# Patient Record
Sex: Male | Born: 1980
Health system: Southern US, Community
[De-identification: ages and names within clinical notes are randomized; demographics above are authoritative.]

## PROBLEM LIST (undated history)

## (undated) DIAGNOSIS — J45909 Unspecified asthma, uncomplicated: Secondary | ICD-10-CM

---

## 2009-08-31 ENCOUNTER — Encounter: Admission: RE | Admit: 2009-08-31 | Discharge: 2009-08-31 | Payer: Self-pay | Admitting: Family Medicine

## 2014-02-07 ENCOUNTER — Emergency Department (HOSPITAL_BASED_OUTPATIENT_CLINIC_OR_DEPARTMENT_OTHER)
Admission: EM | Admit: 2014-02-07 | Discharge: 2014-02-07 | Disposition: A | Discharge status: Home / Self Care | Payer: Self-pay | Attending: Emergency Medicine | Admitting: Emergency Medicine

## 2014-02-07 ENCOUNTER — Encounter (HOSPITAL_BASED_OUTPATIENT_CLINIC_OR_DEPARTMENT_OTHER): Payer: Self-pay | Admitting: Emergency Medicine

## 2014-02-07 ENCOUNTER — Emergency Department (HOSPITAL_BASED_OUTPATIENT_CLINIC_OR_DEPARTMENT_OTHER): Payer: Self-pay

## 2014-02-07 DIAGNOSIS — Y92410 Unspecified street and highway as the place of occurrence of the external cause: Secondary | ICD-10-CM | POA: Insufficient documentation

## 2014-02-07 DIAGNOSIS — Y9389 Activity, other specified: Secondary | ICD-10-CM | POA: Insufficient documentation

## 2014-02-07 DIAGNOSIS — S161XXA Strain of muscle, fascia and tendon at neck level, initial encounter: Secondary | ICD-10-CM | POA: Insufficient documentation

## 2014-02-07 DIAGNOSIS — J45909 Unspecified asthma, uncomplicated: Secondary | ICD-10-CM | POA: Insufficient documentation

## 2014-02-07 DIAGNOSIS — S0990XA Unspecified injury of head, initial encounter: Secondary | ICD-10-CM | POA: Insufficient documentation

## 2014-02-07 DIAGNOSIS — S8012XA Contusion of left lower leg, initial encounter: Secondary | ICD-10-CM | POA: Insufficient documentation

## 2014-02-07 HISTORY — DX: Unspecified asthma, uncomplicated: J45.909

## 2014-02-07 MED ORDER — IBUPROFEN 800 MG PO TABS
800.0000 mg | ORAL_TABLET | Freq: Once | ORAL | Status: AC
  Administered 2014-02-07: 800 mg via ORAL
  Filled 2014-02-07: qty 1
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
## 2014-02-07 NOTE — ED Notes (Signed)
Pt given ice packs for home use- directed to pharmacy to pick ibuprofen if needed at d/c- pt ambulatory with steady gait at d/c

## 2014-02-09 ENCOUNTER — Emergency Department (HOSPITAL_BASED_OUTPATIENT_CLINIC_OR_DEPARTMENT_OTHER)
Admission: EM | Admit: 2014-02-09 | Discharge: 2014-02-09 | Disposition: A | Discharge status: Home / Self Care | Payer: Self-pay | Attending: Emergency Medicine | Admitting: Emergency Medicine

## 2014-02-09 ENCOUNTER — Encounter (HOSPITAL_BASED_OUTPATIENT_CLINIC_OR_DEPARTMENT_OTHER): Payer: Self-pay | Admitting: Emergency Medicine

## 2014-02-09 DIAGNOSIS — S8991XD Unspecified injury of right lower leg, subsequent encounter: Secondary | ICD-10-CM | POA: Insufficient documentation

## 2014-02-09 DIAGNOSIS — S2020XD Contusion of thorax, unspecified, subsequent encounter: Secondary | ICD-10-CM | POA: Insufficient documentation

## 2014-02-09 DIAGNOSIS — S0990XD Unspecified injury of head, subsequent encounter: Secondary | ICD-10-CM | POA: Insufficient documentation

## 2014-02-09 DIAGNOSIS — S20219D Contusion of unspecified front wall of thorax, subsequent encounter: Secondary | ICD-10-CM

## 2014-02-09 DIAGNOSIS — J45909 Unspecified asthma, uncomplicated: Secondary | ICD-10-CM | POA: Insufficient documentation

## 2014-02-09 DIAGNOSIS — S161XXD Strain of muscle, fascia and tendon at neck level, subsequent encounter: Secondary | ICD-10-CM | POA: Insufficient documentation

## 2014-02-09 DIAGNOSIS — S3982XD Other specified injuries of lower back, subsequent encounter: Secondary | ICD-10-CM | POA: Insufficient documentation

## 2014-02-09 MED ORDER — CYCLOBENZAPRINE HCL 10 MG PO TABS: 10.0000 mg | ORAL_TABLET | Freq: Three times a day (TID) | ORAL | Status: DC | PRN

## 2014-02-09 MED ORDER — HYDROCODONE-ACETAMINOPHEN 5-325 MG PO TABS: 2.0000 | ORAL_TABLET | ORAL | Status: DC | PRN
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
## 2014-02-09 NOTE — Discharge Instructions (Signed)
Cervical Sprain °A cervical sprain is an injury in the neck in which the strong, fibrous tissues (ligaments) that connect your neck bones stretch or tear. Cervical sprains can range from mild to severe. Severe cervical sprains can cause the neck vertebrae to be unstable. This can lead to damage of the spinal cord and can result in serious nervous system problems. The amount of time it takes for a cervical sprain to get better depends on the cause and extent of the injury. Most cervical sprains heal in 1 to 3 weeks. °CAUSES  °Severe cervical sprains may be caused by:  °· Contact sport injuries (such as from football, rugby, wrestling, hockey, auto racing, gymnastics, diving, martial arts, or boxing).   °· Motor vehicle collisions.   °· Whiplash injuries. This is an injury from a sudden forward and backward whipping movement of the head and neck.  °· Falls.   °Mild cervical sprains may be caused by:  °· Being in an awkward position, such as while cradling a telephone between your ear and shoulder.   °· Sitting in a chair that does not offer proper support.   °· Working at a poorly designed computer station.   °· Looking up or down for long periods of time.   °SYMPTOMS  °· Pain, soreness, stiffness, or a burning sensation in the front, back, or sides of the neck. This discomfort may develop immediately after the injury or slowly, 24 hours or more after the injury.   °· Pain or tenderness directly in the middle of the back of the neck.   °· Shoulder or upper back pain.   °· Limited ability to move the neck.   °· Headache.   °· Dizziness.   °· Weakness, numbness, or tingling in the hands or arms.   °· Muscle spasms.   °· Difficulty swallowing or chewing.   °· Tenderness and swelling of the neck.   °DIAGNOSIS  °Most of the time your health care provider can diagnose a cervical sprain by taking your history and doing a physical exam. Your health care provider will ask about previous neck injuries and any known neck  problems, such as arthritis in the neck. X-rays may be taken to find out if there are any other problems, such as with the bones of the neck. Other tests, such as a CT scan or MRI, may also be needed.  °TREATMENT  °Treatment depends on the severity of the cervical sprain. Mild sprains can be treated with rest, keeping the neck in place (immobilization), and pain medicines. Severe cervical sprains are immediately immobilized. Further treatment is done to help with pain, muscle spasms, and other symptoms and may include: °· Medicines, such as pain relievers, numbing medicines, or muscle relaxants.   °· Physical therapy. This may involve stretching exercises, strengthening exercises, and posture training. Exercises and improved posture can help stabilize the neck, strengthen muscles, and help stop symptoms from returning.   °HOME CARE INSTRUCTIONS  °· Put ice on the injured area.   °¨ Put ice in a plastic bag.   °¨ Place a towel between your skin and the bag.   °¨ Leave the ice on for 15-20 minutes, 3-4 times a day.   °· If your injury was severe, you may have been given a cervical collar to wear. A cervical collar is a two-piece collar designed to keep your neck from moving while it heals. °¨ Do not remove the collar unless instructed by your health care provider. °¨ If you have long hair, keep it outside of the collar. °¨ Ask your health care provider before making any adjustments to your collar. Minor   adjustments may be required over time to improve comfort and reduce pressure on your chin or on the back of your head. °· If you are allowed to remove the collar for cleaning or bathing, follow your health care provider's instructions on how to do so safely. °· Keep your collar clean by wiping it with mild soap and water and drying it completely. If the collar you have been given includes removable pads, remove them every 1-2 days and hand wash them with soap and water. Allow them to air dry. They should be completely  dry before you wear them in the collar. °· If you are allowed to remove the collar for cleaning and bathing, wash and dry the skin of your neck. Check your skin for irritation or sores. If you see any, tell your health care provider. °· Do not drive while wearing the collar.   °· Only take over-the-counter or prescription medicines for pain, discomfort, or fever as directed by your health care provider.   °· Keep all follow-up appointments as directed by your health care provider.   °· Keep all physical therapy appointments as directed by your health care provider.   °· Make any needed adjustments to your workstation to promote good posture.   °· Avoid positions and activities that make your symptoms worse.   °· Warm up and stretch before being active to help prevent problems.   °SEEK MEDICAL CARE IF:  °· Your pain is not controlled with medicine.   °· You are unable to decrease your pain medicine over time as planned.   °· Your activity level is not improving as expected.   °SEEK IMMEDIATE MEDICAL CARE IF:  °· You develop any bleeding. °· You develop stomach upset. °· You have signs of an allergic reaction to your medicine.   °· Your symptoms get worse.   °· You develop new, unexplained symptoms.   °· You have numbness, tingling, weakness, or paralysis in any part of your body.   °MAKE SURE YOU:  °· Understand these instructions. °· Will watch your condition. °· Will get help right away if you are not doing well or get worse. °Document Released: 02/16/2007 Document Revised: 04/26/2013 Document Reviewed: 10/27/2012 °[employer]® Patient Information ©2015 [employer]. This information is not intended to replace advice given to you by your health care provider. Make sure you discuss any questions you have with your health care provider. ° °Blunt Chest Trauma °Blunt chest trauma is an injury caused by a blow to the chest. These chest injuries can be very painful. Blunt chest trauma often results in bruised or broken  (fractured) ribs. Most cases of bruised and fractured ribs from blunt chest traumas get better after 1 to 3 weeks of rest and pain medicine. Often, the soft tissue in the chest wall is also injured, causing pain and bruising. Internal organs, such as the heart and lungs, may also be injured. Blunt chest trauma can lead to serious medical problems. This injury requires immediate medical care. °CAUSES  °· Motor vehicle collisions. °· Falls. °· Physical violence. °· Sports injuries. °SYMPTOMS  °· Chest pain. The pain may be worse when you move or breathe deeply. °· Shortness of breath. °· Lightheadedness. °· Bruising. °· Tenderness. °· Swelling. °DIAGNOSIS  °Your caregiver will do a physical exam. X-rays may be taken to look for fractures. However, minor rib fractures may not show up on X-rays until a few days after the injury. If a more serious injury is suspected, further imaging tests may be done. This may include ultrasounds, computed tomography (CT) scans, or magnetic   resonance imaging (MRI). °TREATMENT  °Treatment depends on the severity of your injury. Your caregiver may prescribe pain medicines and deep breathing exercises. °HOME CARE INSTRUCTIONS °· Limit your activities until you can move around without much pain. °· Do not do any strenuous work until your injury is healed. °· Put ice on the injured area. °¨ Put ice in a plastic bag. °¨ Place a towel between your skin and the bag. °¨ Leave the ice on for 15-20 minutes, 03-04 times a day. °· You may wear a rib belt as directed by your caregiver to reduce pain. °· Practice deep breathing as directed by your caregiver to keep your lungs clear. °· Only take over-the-counter or prescription medicines for pain, fever, or discomfort as directed by your caregiver. °SEEK IMMEDIATE MEDICAL CARE IF:  °· You have increasing pain or shortness of breath. °· You cough up blood. °· You have nausea, vomiting, or abdominal pain. °· You have a fever. °· You feel dizzy, weak, or  you faint. °MAKE SURE YOU: °· Understand these instructions. °· Will watch your condition. °· Will get help right away if you are not doing well or get worse. °Document Released: 05/29/2004 Document Revised: 07/14/2011 Document Reviewed: 02/05/2011 °[employer]® Patient Information ©2015 [employer]. This information is not intended to replace advice given to you by your health care provider. Make sure you discuss any questions you have with your health care provider. ° °

## 2016-01-29 ENCOUNTER — Ambulatory Visit (INDEPENDENT_AMBULATORY_CARE_PROVIDER_SITE_OTHER): Payer: Self-pay | Admitting: Physician Assistant

## 2016-01-29 VITALS — BP 124/80 | HR 67 | Temp 99.1°F | Resp 17 | Ht 73.5 in | Wt 201.0 lb

## 2016-01-29 DIAGNOSIS — K5900 Constipation, unspecified: Secondary | ICD-10-CM

## 2016-01-29 DIAGNOSIS — Z0000 Encounter for general adult medical examination without abnormal findings: Secondary | ICD-10-CM

## 2016-01-29 LAB — POCT CBC
Granulocyte percent: 43.9 %G (ref 37–80)
HCT, POC: 39.2 % — AB (ref 43.5–53.7)
Hemoglobin: 13.4 g/dL — AB (ref 14.1–18.1)
Lymph, poc: 2.5 (ref 0.6–3.4)
MCH, POC: 26.3 pg — AB (ref 27–31.2)
MCHC: 34.2 g/dL (ref 31.8–35.4)
MCV: 76.7 fL — AB (ref 80–97)
MID (cbc): 0.5 (ref 0–0.9)
MPV: 8.1 fL (ref 0–99.8)
POC Granulocyte: 2.3 (ref 2–6.9)
POC LYMPH PERCENT: 47.6 %L (ref 10–50)
POC MID %: 8.5 %M (ref 0–12)
Platelet Count, POC: 280 10*3/uL (ref 142–424)
RBC: 5.11 M/uL (ref 4.69–6.13)
RDW, POC: 12 %
WBC: 5.3 10*3/uL (ref 4.6–10.2)

## 2016-01-29 LAB — BASIC METABOLIC PANEL
BUN: 10 mg/dL (ref 7–25)
CO2: 25 mmol/L (ref 20–31)
Calcium: 9.8 mg/dL (ref 8.6–10.3)
Chloride: 105 mmol/L (ref 98–110)
Creat: 0.96 mg/dL (ref 0.60–1.35)
Glucose, Bld: 89 mg/dL (ref 65–99)
Potassium: 4.6 mmol/L (ref 3.5–5.3)
Sodium: 138 mmol/L (ref 135–146)

## 2016-01-29 LAB — LIPID PANEL
Cholesterol: 210 mg/dL — ABNORMAL HIGH (ref 125–200)
HDL: 41 mg/dL (ref >=40)
LDL Cholesterol: 156 mg/dL — ABNORMAL HIGH (ref <130)
Total CHOL/HDL Ratio: 5.1 Ratio — ABNORMAL HIGH (ref <=5.0)
Triglycerides: 67 mg/dL (ref <150)
VLDL: 13 mg/dL (ref <30)

## 2016-01-29 NOTE — Patient Instructions (Addendum)
Call you insurance and ask if you are covered for Dental insurance, ask which providers are covered under your insurance. Make an appointment and go!   Drink at least 2 liters of water a day and eat at least 20-35 grams of fiber a day. This combination will help you have softer and more regular stools.     Constipation, Adult Constipation is when a person has fewer than three bowel movements a week, has difficulty having a bowel movement, or has stools that are dry, hard, or larger than normal. As people grow older, constipation is more common. A low-fiber diet, not taking in enough fluids, and taking certain medicines may make constipation worse.  CAUSES   Certain medicines, such as antidepressants, pain medicine, iron supplements, antacids, and water pills.   Certain diseases, such as diabetes, irritable bowel syndrome (IBS), thyroid disease, or depression.   Not drinking enough water.   Not eating enough fiber-rich foods.   Stress or travel.   Lack of physical activity or exercise.   Ignoring the urge to have a bowel movement.   Using laxatives too much.  SIGNS AND SYMPTOMS   Having fewer than three bowel movements a week.   Straining to have a bowel movement.   Having stools that are hard, dry, or larger than normal.   Feeling full or bloated.   Pain in the lower abdomen.   Not feeling relief after having a bowel movement.  DIAGNOSIS  Your health care provider will take a medical history and perform a physical exam. Further testing may be done for severe constipation. Some tests may include:  A barium enema X-ray to examine your rectum, colon, and, sometimes, your small intestine.   A sigmoidoscopy to examine your lower colon.   A colonoscopy to examine your entire colon. TREATMENT  Treatment will depend on the severity of your constipation and what is causing it. Some dietary treatments include drinking more fluids and eating more fiber-rich foods.  Lifestyle treatments may include regular exercise. If these diet and lifestyle recommendations do not help, your health care provider may recommend taking over-the-counter laxative medicines to help you have bowel movements. Prescription medicines may be prescribed if over-the-counter medicines do not work.  HOME CARE INSTRUCTIONS   Eat foods that have a lot of fiber, such as fruits, vegetables, whole grains, and beans.  Limit foods high in fat and processed sugars, such as french fries, hamburgers, cookies, candies, and soda.   A fiber supplement may be added to your diet if you cannot get enough fiber from foods.   Drink enough fluids to keep your urine clear or pale yellow. At least 2 liters  Exercise regularly or as directed by your health care provider.   Go to the restroom when you have the urge to go. Do not hold it.   Only take over-the-counter or prescription medicines as directed by your health care provider. Do not take other medicines for constipation without talking to your health care provider first.  SEEK IMMEDIATE MEDICAL CARE IF:   You have bright red blood in your stool.   Your constipation lasts for more than 4 days or gets worse.   You have abdominal or rectal pain.   You have thin, pencil-like stools.   You have unexplained weight loss.  This information is not intended to replace advice given to you by your health care provider. Make sure you discuss any questions you have with your health care provider.    Heart-Healthy  Eating Plan Many factors influence your heart health, including eating and exercise habits. Heart (coronary) risk increases with abnormal blood fat (lipid) levels. Heart-healthy meal planning includes limiting unhealthy fats, increasing healthy fats, and making other small dietary changes. This includes maintaining a healthy body weight to help keep lipid levels within a normal range. WHAT TYPES OF FAT SHOULD I CHOOSE?  Choose  healthy fats more often. Choose monounsaturated and polyunsaturated fats, such as olive oil and canola oil, flaxseeds, walnuts, almonds, and seeds.  Eat more omega-3 fats. Good choices include salmon, mackerel, sardines, tuna, flaxseed oil, and ground flaxseeds. Aim to eat fish at least two times each week.  Limit saturated fats. Saturated fats are primarily found in animal products, such as meats, butter, and cream. Plant sources of saturated fats include palm oil, palm kernel oil, and coconut oil.  Avoid foods with partially hydrogenated oils in them. These contain trans fats. Examples of foods that contain trans fats are stick margarine, some tub margarines, cookies, crackers, and other baked goods. WHAT GENERAL GUIDELINES DO I NEED TO FOLLOW?  Check food labels carefully to identify foods with trans fats or high amounts of saturated fat.  Fill one half of your plate with vegetables and green salads. Eat 4-5 servings of vegetables per day. A serving of vegetables equals 1 cup of raw leafy vegetables,  cup of raw or cooked cut-up vegetables, or  cup of vegetable juice.  Fill one fourth of your plate with whole grains. Look for the word "whole" as the first word in the ingredient list.  Fill one fourth of your plate with lean protein foods.  Eat 4-5 servings of fruit per day. A serving of fruit equals one medium whole fruit,  cup of dried fruit,  cup of fresh, frozen, or canned fruit, or  cup of 100% fruit juice.  Eat more foods that contain soluble fiber. Examples of foods that contain this type of fiber are apples, broccoli, carrots, beans, peas, and barley. Aim to get 20-30 g of fiber per day.  Eat more home-cooked food and less restaurant, buffet, and fast food.  Limit or avoid alcohol.  Limit foods that are high in starch and sugar.  Avoid fried foods.  Cook foods by using methods other than frying. Baking, boiling, grilling, and broiling are all great options. Other  fat-reducing suggestions include:  Removing the skin from poultry.  Removing all visible fats from meats.  Skimming the fat off of stews, soups, and gravies before serving them.  Steaming vegetables in water or broth.  Lose weight if you are overweight. Losing just 5-10% of your initial body weight can help your overall health and prevent diseases such as diabetes and heart disease.  Increase your consumption of nuts, legumes, and seeds to 4-5 servings per week. One serving of dried beans or legumes equals  cup after being cooked, one serving of nuts equals 1 ounces, and one serving of seeds equals  ounce or 1 tablespoon.  You may need to monitor your salt (sodium) intake, especially if you have high blood pressure. Talk with your health care provider or dietitian to get more information about reducing sodium. WHAT FOODS CAN I EAT? Grains Breads, including [place], white, pita, wheat, raisin, rye, oatmeal, and [place]. Tortillas that are neither fried nor made with lard or trans fat. Low-fat rolls, including hotdog and hamburger buns and English muffins. Biscuits. Muffins. Waffles. Pancakes. Light popcorn. Whole-grain cereals. Flatbread. Melba toast. Pretzels. Breadsticks. Rusks. Low-fat snacks and  crackers, including oyster, saltine, matzo, graham, animal, and rye. Rice and pasta, including brown rice and those that are made with whole wheat. Vegetables All vegetables. Fruits All fruits, but limit coconut. Meats and Other Protein Sources Lean, well-trimmed beef, veal, pork, and lamb. Chicken and [place] without skin. All fish and shellfish. Wild duck, rabbit, pheasant, and venison. Egg whites or low-cholesterol egg substitutes. Dried beans, peas, lentils, and tofu.Seeds and most nuts. Dairy Low-fat or nonfat cheeses, including ricotta, string, and mozzarella. Skim or 1% milk that is liquid, powdered, or evaporated. Buttermilk that is made with low-fat milk. Nonfat or low-fat  yogurt. Beverages Mineral water. Diet carbonated beverages. Sweets and Desserts Sherbets and fruit ices. Honey, jam, marmalade, jelly, and syrups. Meringues and gelatins. Pure sugar candy, such as hard candy, jelly beans, gumdrops, mints, marshmallows, and small amounts of dark chocolate. [employer]. Eat all sweets and desserts in moderation. Fats and Oils Nonhydrogenated (trans-free) margarines. Vegetable oils, including soybean, sesame, sunflower, olive, peanut, safflower, corn, canola, and cottonseed. Salad dressings or mayonnaise that are made with a vegetable oil. Limit added fats and oils that you use for cooking, baking, salads, and as spreads. Other Cocoa powder. Coffee and tea. All seasonings and condiments. The items listed above may not be a complete list of recommended foods or beverages. Contact your dietitian for more options. WHAT FOODS ARE NOT RECOMMENDED? Grains Breads that are made with saturated or trans fats, oils, or whole milk. Croissants. Butter rolls. Cheese breads. Sweet rolls. Donuts. Buttered popcorn. Chow mein noodles. High-fat crackers, such as cheese or butter crackers. Meats and Other Protein Sources Fatty meats, such as hotdogs, short ribs, sausage, spareribs, bacon, ribeye roast or steak, and mutton. High-fat deli meats, such as salami and bologna. Caviar. Domestic duck and goose. Organ meats, such as kidney, liver, sweetbreads, brains, gizzard, chitterlings, and heart. Dairy Cream, sour cream, cream cheese, and creamed cottage cheese. Whole milk cheeses, including blue (bleu), [address], [place], [place], [address], [place], [address], [place], [place], and [place]. Whole or 2% milk that is liquid, evaporated, or condensed. Whole buttermilk. Cream sauce or high-fat cheese sauce. Yogurt that is made from whole milk. Beverages Regular sodas and drinks with added sugar. Sweets and Desserts Frosting. Pudding. Cookies. Cakes other than angel food cake. Candy that  has milk chocolate or white chocolate, hydrogenated fat, butter, coconut, or unknown ingredients. Buttered syrups. Full-fat ice cream or ice cream drinks. Fats and Oils Gravy that has suet, meat fat, or shortening. Cocoa butter, hydrogenated oils, palm oil, coconut oil, palm kernel oil. These can often be found in baked products, candy, fried foods, nondairy creamers, and whipped toppings. Solid fats and shortenings, including bacon fat, salt pork, lard, and butter. Nondairy cream substitutes, such as coffee creamers and sour cream substitutes. Salad dressings that are made of unknown oils, cheese, or sour cream. The items listed above may not be a complete list of foods and beverages to avoid. Contact your dietitian for more information.   This information is not intended to replace advice given to you by your health care provider. Make sure you discuss any questions you have with your health care provider.   Exercising to Stay Healthy Exercising regularly is important. It has many health benefits, such as:  Improving your overall fitness, flexibility, and endurance.  Increasing your bone density.  Helping with weight control.  Decreasing your body fat.  Increasing your muscle strength.  Reducing stress and tension.  Improving your overall health. In order to become  healthy and stay healthy, it is recommended that you do moderate-intensity and vigorous-intensity exercise. You can tell that you are exercising at a moderate intensity if you have a higher heart rate and faster breathing, but you are still able to hold a conversation. You can tell that you are exercising at a vigorous intensity if you are breathing much harder and faster and cannot hold a conversation while exercising. HOW OFTEN SHOULD I EXERCISE? Choose an activity that you enjoy and set realistic goals. Your health care provider can help you to make an activity plan that works for you. Exercise regularly as directed by your  health care provider. This may include:   Doing resistance training twice each week, such as:  Push-ups.  Sit-ups.  Lifting weights.  Using resistance bands.  Doing a given intensity of exercise for a given amount of time. Choose from these options:  150 minutes of moderate-intensity exercise every week.  75 minutes of vigorous-intensity exercise every week.  A mix of moderate-intensity and vigorous-intensity exercise every week. Children, pregnant women, people who are out of shape, people who are overweight, and older adults may need to consult a health care provider for individual recommendations. If you have any sort of medical condition, be sure to consult your health care provider before starting a new exercise program.  WHAT ARE SOME EXERCISE IDEAS? Some moderate-intensity exercise ideas include:   Walking at a rate of 1 mile in 15 minutes.  Biking.  Hiking.  Golfing.  Dancing. Some vigorous-intensity exercise ideas include:   Walking at a rate of at least 4.5 miles per hour.  Jogging or running at a rate of 5 miles per hour.  Biking at a rate of at least 10 miles per hour.  Lap swimming.  Roller-skating or in-line skating.  Cross-country skiing.  Vigorous competitive sports, such as football, basketball, and soccer.  Jumping rope.  Aerobic dancing. WHAT ARE SOME EVERYDAY ACTIVITIES THAT CAN HELP ME TO GET EXERCISE?  Yard work, such as:  Child psychotherapist.  Raking and bagging leaves.  Washing and waxing your car.  Pushing a stroller.  Shoveling snow.  Gardening.  Washing windows or floors. HOW CAN I BE MORE ACTIVE IN MY DAY-TO-DAY ACTIVITIES?  Use the stairs instead of the elevator.  Take a walk during your lunch break.  If you drive, park your car farther away from work or school.  If you take public transportation, get off one stop early and walk the rest of the way.  Make all of your phone calls while standing up and walking  around.  Get up, stretch, and walk around every 30 minutes throughout the day. WHAT GUIDELINES SHOULD I FOLLOW WHILE EXERCISING?  Do not exercise so much that you hurt yourself, feel dizzy, or get very short of breath.  Consult your health care provider before starting a new exercise program.  Wear comfortable clothes and shoes with good support.  Drink plenty of water while you exercise to prevent dehydration or heat stroke. Body water is lost during exercise and must be replaced.  Work out until you breathe faster and your heart beats faster.

## 2016-01-29 NOTE — Progress Notes (Signed)
[facility] [address], [place] [address] [phone]  Date:  01/29/2016   Name:  Lee Alvarez   DOB:  1981-04-29   MRN:  [id number]  PCP:  No PCP Per Patient    Chief Complaint: Annual Exam   History of Present Illness:  This is a 35 y.o. male with no reported PMH who is presenting for CPE. Feeling well, no complaints.   Last physical 3 years ago, cannot recall which physician group. Notes he was told he had elevated cholesterol at that time. Started eating a good diet and getting regular exercise. Lost 30 lbs. Notes he has gained the weight back, does not exercise and doesn't eat a healthy diet. 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 Sits a lot for work, travels out of the country often for work. No medications/allergies to medications.  Seasonal allergies. Doesn't take anything. Notes they are not that bad. Worse in the spring.  Felt bump on testicles last year. No pain. Not there anymore.  Bowel movements every three days. Hard stools. No blood. Drinks one cup of water per day.   Complaints:  None Immunizations: believes he is UTD Dentist: Never.  Eye: None  Diet: Eats anything.  Exercise: None.  Fam hx: Uncle stroke. Deceased.  Sexual hx: Married. Same partner 9 years. Sexually active.  Urinary hesitancy/frequency or nocturia: None Problems with erectile dysfunction: None Tobacco/alcohol/substance use: None   Review of Systems:  Review of Systems  Constitutional: Negative for activity change, appetite change and fatigue.  HENT: Negative for congestion, dental problem, sneezing and tinnitus.   Eyes: Negative for visual disturbance.  Respiratory: Negative for cough, chest tightness, shortness of breath and wheezing.   Cardiovascular: Negative for chest pain, palpitations and leg swelling.  Gastrointestinal: Positive for constipation. Negative for abdominal pain, blood in stool, diarrhea, nausea and vomiting.  Endocrine: Negative  for polydipsia, polyphagia and polyuria.  Genitourinary: Negative for decreased urine volume, difficulty urinating, discharge, hematuria, scrotal swelling and testicular pain.  Musculoskeletal: Negative for arthralgias, back pain and neck stiffness.  Allergic/Immunologic: Negative for environmental allergies and food allergies.  Neurological: Negative for dizziness, syncope, weakness, light-headedness and headaches.  Psychiatric/Behavioral: Negative for sleep disturbance. The patient is not nervous/anxious.     There are no active problems to display for this patient.   Prior to Admission medications   Not on File    Allergies  Allergen Reactions  . Other     Fresh fruit- makes mouth breakout  . Quinine Derivatives Itching    No past surgical history on file.  Social History  Substance Use Topics  . Smoking status: Never Smoker  . Smokeless tobacco: Never Used  . Alcohol use No    No family history on file.  Medication list has been reviewed and updated.  Physical Examination:  Physical Exam  Constitutional: He is oriented to person, place, and time. He appears well-developed and well-nourished. No distress.  HENT:  Head: Normocephalic and atraumatic.  Right Ear: External ear normal.  Left Ear: External ear normal.  Mouth/Throat: Oropharynx is clear and moist. No oropharyngeal exudate.  Eyes: Conjunctivae and EOM are normal. Pupils are equal, round, and reactive to light.  Neck: Normal range of motion. Neck supple. No thyromegaly present.  Cardiovascular: Normal rate, regular rhythm, normal heart sounds and intact distal pulses.   No murmur heard. Pulmonary/Chest: Effort normal and breath sounds normal. No respiratory distress. He has no wheezes.  Abdominal: Soft. Normal appearance  and bowel sounds are normal. There is no tenderness.  Genitourinary: Right testis shows no mass, no swelling and no tenderness. Left testis shows no mass, no swelling and no tenderness.   Neurological: He is alert and oriented to person, place, and time.  Skin: Skin is warm and dry.  Psychiatric: He has a normal mood and affect. His behavior is normal. Judgment and thought content normal.  Vitals reviewed.   BP 124/80 (BP Location: Right Arm, Patient Position: Sitting, Cuff Size: Normal)   Pulse 67   Temp 99.1 F (37.3 C) (Oral)   Resp 17   Ht 6' 1.5" (1.867 m)   Wt 201 lb (91.2 kg)   SpO2 99%   BMI 26.16 kg/m   Assessment and Plan: 1. Annual physical exam - POCT CBC - HIV antibody - Lipid panel - Basic metabolic panel - Discussed with patient importance of regular exercise and a healthy diet. Will let patient know about lab results.   - Recommended patient make appointment with dentist, as he has never been.  - RTC PRN.  2. Constipation, unspecified constipation type - Information discussed and printed off for patient concerning constipation prevention and daily fiber/water intake. Drink at least 2 liters of water a day and eat at least 20-35 grams of fiber a day.

## 2016-01-30 ENCOUNTER — Encounter: Payer: Self-pay | Admitting: Physician Assistant

## 2016-01-30 LAB — HIV ANTIBODY (ROUTINE TESTING W REFLEX): HIV 1&2 Ab, 4th Generation: NONREACTIVE

## 2016-01-30 NOTE — Progress Notes (Signed)
Letter sent to patient about elevated lipid levels. Will try to correct this with lifestyle modification.  Information given about eating better diet and getting regular exercise.
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
## 2017-10-21 ENCOUNTER — Other Ambulatory Visit: Payer: Self-pay

## 2017-10-21 ENCOUNTER — Ambulatory Visit (INDEPENDENT_AMBULATORY_CARE_PROVIDER_SITE_OTHER): Payer: BLUE CROSS/BLUE SHIELD | Admitting: Physician Assistant

## 2017-10-21 ENCOUNTER — Encounter: Payer: Self-pay | Admitting: Physician Assistant

## 2017-10-21 VITALS — BP 116/82 | HR 63 | Temp 98.2°F | Resp 16 | Ht 73.0 in | Wt 200.4 lb

## 2017-10-21 DIAGNOSIS — Z113 Encounter for screening for infections with a predominantly sexual mode of transmission: Secondary | ICD-10-CM | POA: Diagnosis not present

## 2017-10-21 DIAGNOSIS — R079 Chest pain, unspecified: Secondary | ICD-10-CM

## 2017-10-21 DIAGNOSIS — Z13228 Encounter for screening for other metabolic disorders: Secondary | ICD-10-CM | POA: Diagnosis not present

## 2017-10-21 DIAGNOSIS — Z130 Encounter for screening for diseases of the blood and blood-forming organs and certain disorders involving the immune mechanism: Secondary | ICD-10-CM

## 2017-10-21 DIAGNOSIS — Z0000 Encounter for general adult medical examination without abnormal findings: Secondary | ICD-10-CM | POA: Diagnosis not present

## 2017-10-21 DIAGNOSIS — Z1329 Encounter for screening for other suspected endocrine disorder: Secondary | ICD-10-CM

## 2017-10-21 DIAGNOSIS — R0981 Nasal congestion: Secondary | ICD-10-CM

## 2017-10-21 DIAGNOSIS — N509 Disorder of male genital organs, unspecified: Secondary | ICD-10-CM | POA: Diagnosis not present

## 2017-10-21 DIAGNOSIS — N5089 Other specified disorders of the male genital organs: Secondary | ICD-10-CM

## 2017-10-21 DIAGNOSIS — Z13220 Encounter for screening for lipoid disorders: Secondary | ICD-10-CM

## 2017-10-21 LAB — LIPID PANEL
Chol/HDL Ratio: 5.7 ratio — ABNORMAL HIGH (ref 0.0–5.0)
Cholesterol, Total: 215 mg/dL — ABNORMAL HIGH (ref 100–199)
HDL: 38 mg/dL — ABNORMAL LOW (ref >39)
LDL Calculated: 145 mg/dL — ABNORMAL HIGH (ref 0–99)
Triglycerides: 162 mg/dL — ABNORMAL HIGH (ref 0–149)
VLDL Cholesterol Cal: 32 mg/dL (ref 5–40)

## 2017-10-21 LAB — CMP14+EGFR
ALT: 37 IU/L (ref 0–44)
AST: 25 IU/L (ref 0–40)
Albumin/Globulin Ratio: 1.5 (ref 1.2–2.2)
Albumin: 4.6 g/dL (ref 3.5–5.5)
Alkaline Phosphatase: 52 IU/L (ref 39–117)
BUN/Creatinine Ratio: 13 (ref 9–20)
BUN: 12 mg/dL (ref 6–20)
Bilirubin Total: 0.4 mg/dL (ref 0.0–1.2)
CO2: 25 mmol/L (ref 20–29)
Calcium: 10 mg/dL (ref 8.7–10.2)
Chloride: 102 mmol/L (ref 96–106)
Creatinine, Ser: 0.93 mg/dL (ref 0.76–1.27)
GFR calc Af Amer: 119 mL/min/{1.73_m2} (ref >59)
GFR calc non Af Amer: 103 mL/min/{1.73_m2} (ref >59)
Globulin, Total: 3 g/dL (ref 1.5–4.5)
Glucose: 93 mg/dL (ref 65–99)
Potassium: 4.6 mmol/L (ref 3.5–5.2)
Sodium: 140 mmol/L (ref 134–144)
Total Protein: 7.6 g/dL (ref 6.0–8.5)

## 2017-10-21 LAB — CBC WITH DIFFERENTIAL/PLATELET
Basophils Absolute: 0 10*3/uL (ref 0.0–0.2)
Basos: 1 %
EOS (ABSOLUTE): 0.4 10*3/uL (ref 0.0–0.4)
Eos: 6 %
Hematocrit: 42 % (ref 37.5–51.0)
Hemoglobin: 13.6 g/dL (ref 13.0–17.7)
Immature Grans (Abs): 0 10*3/uL (ref 0.0–0.1)
Immature Granulocytes: 0 %
Lymphocytes Absolute: 2.5 10*3/uL (ref 0.7–3.1)
Lymphs: 42 %
MCH: 25.7 pg — ABNORMAL LOW (ref 26.6–33.0)
MCHC: 32.4 g/dL (ref 31.5–35.7)
MCV: 79 fL (ref 79–97)
Monocytes Absolute: 0.5 10*3/uL (ref 0.1–0.9)
Monocytes: 8 %
Neutrophils Absolute: 2.5 10*3/uL (ref 1.4–7.0)
Neutrophils: 43 %
Platelets: 299 10*3/uL (ref 150–450)
RBC: 5.3 x10E6/uL (ref 4.14–5.80)
RDW: 13.2 % (ref 12.3–15.4)
WBC: 5.9 10*3/uL (ref 3.4–10.8)

## 2017-10-21 LAB — POCT URINALYSIS DIP (MANUAL ENTRY)
Bilirubin, UA: NEGATIVE
Blood, UA: NEGATIVE
Glucose, UA: NEGATIVE mg/dL
Ketones, POC UA: NEGATIVE mg/dL
Leukocytes, UA: NEGATIVE
Nitrite, UA: NEGATIVE
Protein Ur, POC: NEGATIVE mg/dL
Spec Grav, UA: >=1.03 — AB (ref 1.010–1.025)
Urobilinogen, UA: 0.2 E.U./dL
pH, UA: 5.5 (ref 5.0–8.0)

## 2017-10-21 NOTE — Patient Instructions (Addendum)
- Start using Flonase nasal spray at night before bed. Use 2 sprays each nostril. This will help reduce the mucus in the morning.  - Drink at least 32-64 oz water daily.  - You will receive a phone call to schedule an appointment for an ultrasound of your testicles.  - Come back if you are still having pain with cough/sneezing. Start taking ibuprofen or aleve x 1-2 weeks. Take notice if certain foods make this worse.     Health Maintenance, Male A healthy lifestyle and preventive care is important for your health and wellness. Ask your health care provider about what schedule of regular examinations is right for you. What should I know about weight and diet? Eat a Healthy Diet  Eat plenty of vegetables, fruits, whole grains, low-fat dairy products, and lean protein.  Do not eat a lot of foods high in solid fats, added sugars, or salt.  Maintain a Healthy Weight Regular exercise can help you achieve or maintain a healthy weight. You should:  Do at least 150 minutes of exercise each week. The exercise should increase your heart rate and make you sweat (moderate-intensity exercise).  Do strength-training exercises at least twice a week.  Watch Your Levels of Cholesterol and Blood Lipids  Have your blood tested for lipids and cholesterol every 5 years starting at 39 years of age. If you are at high risk for heart disease, you should start having your blood tested when you are 39 years old. You may need to have your cholesterol levels checked more often if: ? Your lipid or cholesterol levels are high. ? You are older than 39 years of age. ? You are at high risk for heart disease.  What should I know about cancer screening? Many types of cancers can be detected early and may often be prevented. Lung Cancer  You should be screened every year for lung cancer if: ? You are a current smoker who has smoked for at least 30 years. ? You are a former smoker who has quit within the past 15  years.  Talk to your health care provider about your screening options, when you should start screening, and how often you should be screened.  Colorectal Cancer  Routine colorectal cancer screening usually begins at 39 years of age and should be repeated every 5-10 years until you are 39 years old. You may need to be screened more often if early forms of precancerous polyps or small growths are found. Your health care provider may recommend screening at an earlier age if you have risk factors for colon cancer.  Your health care provider may recommend using home test kits to check for hidden blood in the stool.  A small camera at the end of a tube can be used to examine your colon (sigmoidoscopy or colonoscopy). This checks for the earliest forms of colorectal cancer.  Prostate and Testicular Cancer  Depending on your age and overall health, your health care provider may do certain tests to screen for prostate and testicular cancer.  Talk to your health care provider about any symptoms or concerns you have about testicular or prostate cancer.  Skin Cancer  Check your skin from head to toe regularly.  Tell your health care provider about any new moles or changes in moles, especially if: ? There is a change in a mole's size, shape, or color. ? You have a mole that is larger than a pencil eraser.  Always use sunscreen. Apply sunscreen liberally and  repeat throughout the day.  Protect yourself by wearing long sleeves, pants, a wide-brimmed hat, and sunglasses when outside.  What should I know about heart disease, diabetes, and high blood pressure?  If you are 58-42 years of age, have your blood pressure checked every 3-5 years. If you are 50 years of age or older, have your blood pressure checked every year. You should have your blood pressure measured twice-once when you are at a hospital or clinic, and once when you are not at a hospital or clinic. Record the average of the two  measurements. To check your blood pressure when you are not at a hospital or clinic, you can use: ? An automated blood pressure machine at a pharmacy. ? A home blood pressure monitor.  Talk to your health care provider about your target blood pressure.  If you are between 43-93 years old, ask your health care provider if you should take aspirin to prevent heart disease.  Have regular diabetes screenings by checking your fasting blood sugar level. ? If you are at a normal weight and have a low risk for diabetes, have this test once every three years after the age of 44. ? If you are overweight and have a high risk for diabetes, consider being tested at a younger age or more often.  A one-time screening for abdominal aortic aneurysm (AAA) by ultrasound is recommended for men aged 90-75 years who are current or former smokers. What should I know about preventing infection? Hepatitis B If you have a higher risk for hepatitis B, you should be screened for this virus. Talk with your health care provider to find out if you are at risk for hepatitis B infection. Hepatitis C Blood testing is recommended for:  Everyone born from [birth year] through 1967.  Anyone with known risk factors for hepatitis C.  Sexually Transmitted Diseases (STDs)  You should be screened each year for STDs including gonorrhea and chlamydia if: ? You are sexually active and are younger than 39 years of age. ? You are older than 39 years of age and your health care provider tells you that you are at risk for this type of infection. ? Your sexual activity has changed since you were last screened and you are at an increased risk for chlamydia or gonorrhea. Ask your health care provider if you are at risk.  Talk with your health care provider about whether you are at high risk of being infected with HIV. Your health care provider may recommend a prescription medicine to help prevent HIV infection.  What else can I do?  Schedule  regular health, dental, and eye exams.  Stay current with your vaccines (immunizations).  Do not use any tobacco products, such as cigarettes, chewing tobacco, and e-cigarettes. If you need help quitting, ask your health care provider.  Limit alcohol intake to no more than 2 drinks per day. One drink equals 12 ounces of beer, 5 ounces of wine, or 1 ounces of hard liquor.  Do not use street drugs.  Do not share needles.  Ask your health care provider for help if you need support or information about quitting drugs.  Tell your health care provider if you often feel depressed.  Tell your health care provider if you have ever been abused or do not feel safe at home. This information is not intended to replace advice given to you by your health care provider. Make sure you discuss any questions you have with your health care  provider. Document Released: 10/18/2007 Document Revised: 12/19/2015 Document Reviewed: 01/23/2015 Elsevier Interactive Patient Education  [employer].   Thank you for coming in today. I hope you feel we met your needs.  Feel free to call PCP if you have any questions or further requests.  Please consider signing up for MyChart if you do not already have it, as this is a great way to communicate with me.  Best,  Whitney McVey, PA-C  IF you received an x-ray today, you will receive an invoice from [facility]. Please contact [facility] at [phone] with questions or concerns regarding your invoice.   IF you received labwork today, you will receive an invoice from [facility]. Please contact [facility] at [phone] with questions or concerns regarding your invoice.   Our billing staff will not be able to assist you with questions regarding bills from these companies.  You will be contacted with the lab results as soon as they are available. The fastest way to get your results is to activate your My Chart account. Instructions are located on  the last page of this paperwork. If you have not heard from [place] regarding the results in 2 weeks, please contact this office.

## 2017-10-21 NOTE — Progress Notes (Signed)
[facility] [place], [place] [address] [phone]  Date:  10/21/2017   Name:  Lee Alvarez   DOB:  October 18, 1980   MRN:  [id number]  PCP:  Patient, No Pcp Per    Chief Complaint: Annual Exam   History of Present Illness:  This is a 37 y.o. male who  has a past medical history of Asthma. and is presenting for CPE. Last CPE was in 2017.  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   Complaints:  1) Testicular mass - he can feel the lump on his testicles again. First time he noticed it was 2013. It went away, then came back. Last CPE no mass was appreciated. He has not noticed a growth in size. No other symptoms. 2) Mucus production in the morning when he wakes up.  3) pain of left breast with sneezing or cough x 4 days. No pain with deep inspiration. No change with bending forward or laying back. No recent illness. He is wondering if he has reflux or something. Felt recent discomfort with eating pizza. Denies chest pain, palpitations, fever, chills, diaphoresis, n/v.   Immunizations: utd Dentist: he still has not yet had routine teeth cleaning.  Eye:  20/20 Exercise:  Lifts weights. "I'm lazy". Too tired to do aerobic exercises Fam hx: family history includes Stroke in his paternal uncle.  Sexual hx: married. Sex with one partner only. HIV negative in 2017. Would like routine STD screening today. He has two children.  Urinary hesitancy/frequency or nocturia: none Problems with erectile dysfunction: none Tobacco/alcohol/substance use: never smoker, no drug use, no alcohol use.    Review of Systems:  Review of Systems  Constitutional: Negative for activity change, appetite change and fatigue.  HENT: Positive for congestion. Negative for dental problem and tinnitus.   Eyes: Negative for visual disturbance.  Respiratory: Negative for cough, chest tightness, shortness of  breath and wheezing.   Cardiovascular: Negative for chest pain, palpitations and leg swelling.  Gastrointestinal: Negative for abdominal pain, blood in stool, diarrhea, nausea and vomiting.  Endocrine: Negative for polydipsia, polyphagia and polyuria.  Genitourinary: Negative for decreased urine volume, difficulty urinating, discharge, hematuria, scrotal swelling and testicular pain.  Musculoskeletal: Negative for arthralgias, back pain and neck stiffness.  Allergic/Immunologic: Negative for environmental allergies and food allergies.  Neurological: Negative for dizziness, syncope, weakness, light-headedness and headaches.  Psychiatric/Behavioral: Negative for sleep disturbance. The patient is not nervous/anxious.     There are no active problems to display for this patient.   Prior to Admission medications   Not on File    Allergies  Allergen Reactions  . Other     Fresh fruit- makes mouth breakout  . Quinine Derivatives Itching    No past surgical history on file.  Social History   Tobacco Use  . Smoking status: Never Smoker  . Smokeless tobacco: Never Used  Substance Use Topics  . Alcohol use: No  . Drug use: No    Family History  Problem Relation Age of Onset  . Stroke Paternal Uncle     Medication list has been reviewed and updated.  Physical Examination:  Physical Exam  Constitutional: He is oriented to person, place, and time. No distress.  HENT:  Head: Normocephalic and atraumatic.  Right Ear: External ear normal.  Left Ear: External ear normal.  Nose: Nose normal.  Mouth/Throat: Oropharynx is  clear and moist. No oropharyngeal exudate.  Eyes: Pupils are equal, round, and reactive to light. Conjunctivae and EOM are normal. Right eye exhibits no discharge. No scleral icterus.  Neck: Normal range of motion. Neck supple. No tracheal deviation present. No thyromegaly present.  Cardiovascular: Normal rate, regular rhythm and intact distal pulses. Exam reveals  no gallop and no friction rub.  No murmur heard. Pulmonary/Chest: Effort normal and breath sounds normal. No respiratory distress. He has no wheezes. He exhibits no mass and no tenderness. Right breast exhibits no tenderness. Left breast exhibits no tenderness.  Abdominal: Soft. Bowel sounds are normal. He exhibits no distension and no mass. There is no tenderness.  Genitourinary: Penis normal. Right testis shows mass (anterior right testicle, 1cm mass). No discharge found.  Musculoskeletal: Normal range of motion.  Lymphadenopathy:    He has no cervical adenopathy.  Neurological: He is alert and oriented to person, place, and time. He has normal reflexes.  Skin: Skin is warm and dry. He is not diaphoretic.  Psychiatric: Judgment normal.    BP 116/82 (BP Location: Left Arm, Patient Position: Sitting, Cuff Size: Normal)   Pulse 63   Temp 98.2 F (36.8 C) (Oral)   Resp 16   Ht _0  (1.854 m)   Wt 200 lb 6.4 oz (90.9 kg)   SpO2 98%   BMI 26.44 kg/m   Lab Results  Component Value Date   CHOL 210 (H) 01/29/2016   HDL 41 01/29/2016   LDLCALC 156 (H) 01/29/2016   TRIG 67 01/29/2016   CHOLHDL 5.1 (H) 01/29/2016   Results for orders placed or performed in visit on 10/21/17  POCT urinalysis dipstick  Result Value Ref Range   Color, UA yellow yellow   Clarity, UA clear clear   Glucose, UA negative negative mg/dL   Bilirubin, UA negative negative   Ketones, POC UA negative negative mg/dL   Spec Grav, UA >=1.030 (A) 1.010 - 1.025   Blood, UA negative negative   pH, UA 5.5 5.0 - 8.0   Protein Ur, POC negative negative mg/dL   Urobilinogen, UA 0.2 0.2 or 1.0 E.U./dL   Nitrite, UA Negative Negative   Leukocytes, UA Negative Negative    Assessment and Plan: 1. Annual physical exam - pt presents for annual exam. Last CPE 2017. Routine labs are pending. Problem list is addressed below. Anticipatory guidance provided.   2. Screening for endocrine, metabolic and immunity disorder -  POCT urinalysis dipstick - CBC with Differential/Platelet - CMP14+EGFR  3. Screening, lipid - Lipid panel  4. Screen for STD (sexually transmitted disease) - Chlamydia/Gonococcus/Trichomonas, NAA  5. Testicular mass - Mass appreciated in right testicle. Plan to get US scrotum for evaluation.  - US Scrotum; Future  6. Head congestion - Flonase q hs.   7. Chest pain, unspecified type - pt c/o occasional chest pain with sneezing or cough. Denies other associated cardiac symptoms. Suspect MSK pain. Advised NSAID x 1 week. RTC if no improvement, or RTC sooner if symptoms worsen. Consider EKG and chest xray.    Mercer Pod, PA-C  [facility] 10/21/2017 8:15 AM

## 2017-10-22 LAB — CHLAMYDIA/GONOCOCCUS/TRICHOMONAS, NAA
Chlamydia by NAA: NEGATIVE
Gonococcus by NAA: NEGATIVE
Trich vag by NAA: NEGATIVE

## 2017-10-22 NOTE — Progress Notes (Signed)
Results released to Mychart. Elevated lipids. Advised improved lifestyle, diet and starting fish oil supp. Recheck in 1 year.

## 2017-10-27 ENCOUNTER — Other Ambulatory Visit: Payer: Self-pay | Admitting: Physician Assistant

## 2017-10-27 ENCOUNTER — Telehealth: Payer: Self-pay | Admitting: Physician Assistant

## 2017-10-27 DIAGNOSIS — N5089 Other specified disorders of the male genital organs: Secondary | ICD-10-CM

## 2017-10-27 NOTE — Telephone Encounter (Signed)
Received call from Bay[facility]Shelby at [facility] concerning order for U/S Scrotum. She is requesting this be changed to U/S scrotum w/ doppler ([id number]). Mitzi DavenportShelby is requesting a callback once this has been changed at [phone].

## 2017-10-27 NOTE — Telephone Encounter (Signed)
Order changed to US scrotum w doppler. Please call and advise of the change. Thank you!

## 2017-10-28 NOTE — Telephone Encounter (Signed)
Called to inform order has been changed,

## 2017-11-16 ENCOUNTER — Ambulatory Visit
Admission: RE | Admit: 2017-11-16 | Discharge: 2017-11-16 | Disposition: A | Discharge status: Home / Self Care | Payer: BLUE CROSS/BLUE SHIELD | Source: Ambulatory Visit | Attending: Physician Assistant | Admitting: Physician Assistant

## 2017-11-16 DIAGNOSIS — N5089 Other specified disorders of the male genital organs: Secondary | ICD-10-CM

## 2017-11-16 DIAGNOSIS — N503 Cyst of epididymis: Secondary | ICD-10-CM | POA: Diagnosis not present

## 2017-11-17 NOTE — Progress Notes (Signed)
Benign cysts. Results released to mychart.

## 2019-08-17 ENCOUNTER — Other Ambulatory Visit: Payer: BLUE CROSS/BLUE SHIELD

## 2020-04-03 ENCOUNTER — Encounter (HOSPITAL_COMMUNITY): Payer: Self-pay | Admitting: Emergency Medicine

## 2020-04-03 ENCOUNTER — Ambulatory Visit (HOSPITAL_COMMUNITY)
Admission: EM | Admit: 2020-04-03 | Discharge: 2020-04-03 | Disposition: A | Discharge status: Home / Self Care | Payer: Self-pay | Attending: Family Medicine | Admitting: Family Medicine

## 2020-04-03 ENCOUNTER — Other Ambulatory Visit: Payer: Self-pay

## 2020-04-03 DIAGNOSIS — M7522 Bicipital tendinitis, left shoulder: Secondary | ICD-10-CM

## 2020-04-03 MED ORDER — PREDNISONE 20 MG PO TABS: 40.0000 mg | ORAL_TABLET | Freq: Every day | ORAL | 0 refills | Status: DC

## 2020-04-03 MED ORDER — DICLOFENAC SODIUM 75 MG PO TBEC: 75.0000 mg | DELAYED_RELEASE_TABLET | Freq: Two times a day (BID) | ORAL | 0 refills | Status: DC

## 2020-04-03 NOTE — ED Provider Notes (Signed)
[facility]   [id number] 04/03/20 Arrival Time: 1750  ASSESSMENT & PLAN:  1. Biceps tendonitis on left     No indications for imaging. Discussed. Work note provided.  Begin trial of: Meds ordered this encounter  Medications  . diclofenac (VOLTAREN) 75 MG EC tablet    Sig: Take 1 tablet (75 mg total) by mouth 2 (two) times daily.    Dispense:  14 tablet    Refill:  0  . predniSONE (DELTASONE) 20 MG tablet    Sig: Take 2 tablets (40 mg total) by mouth daily.    Dispense:  10 tablet    Refill:  0    Encourage ROM as tolerated.  Recommend:  Follow-up Information    [facility].   Why: If worsening or failing to improve as anticipated. Contact information: [address] [place] [address] [phone]              Reviewed expectations re: course of current medical issues. Questions answered. Outlined signs and symptoms indicating need for more acute intervention. Patient verbalized understanding. After Visit Summary given.  SUBJECTIVE: History from: patient. Lee Alvarez is a 39 y.o. male who reports pain of anterior L shoulder for several days. No trauma. Gradually worsening and is worse when lifting LUE. No h/o similar. OTC 200mg  with no relief. No extremity sensation changes or weakness.    OBJECTIVE:  Vitals:   04/03/20 1913  BP: 115/77  Pulse: 70  Resp: 17  Temp: 98.1 F (36.7 C)  TempSrc: Oral  SpO2: 97%    General appearance: alert; no distress HEENT: [place]; AT Neck: supple with FROM Resp: unlabored respirations Extremities: . LUE: warm with well perfused appearance; very TTP over proximal biceps tendon insertion; L shoulder with FROM but with reported pain CV: brisk extremity capillary refill of RUE; 2+ radial pulse of RUE. Skin: warm and dry; no visible rashes Neurologic: gait normal; normal sensation and strength of LUE Psychological: alert and cooperative; normal mood and  affect     Allergies  Allergen Reactions  . Other     Fresh fruit- makes mouth breakout  . Quinine Derivatives Itching    Past Medical History:  Diagnosis Date  . Asthma    Social History   Socioeconomic History  . Marital status: Single    Spouse name: Not on file  . Number of children: Not on file  . Years of education: Not on file  . Highest education level: Not on file  Occupational History  . Not on file  Tobacco Use  . Smoking status: Never Smoker  . Smokeless tobacco: Never Used  Substance and Sexual Activity  . Alcohol use: No  . Drug use: No  . Sexual activity: Yes  Other Topics Concern  . Not on file  Social History Narrative  . Not on file   Social Determinants of Health   Financial Resource Strain:   . Difficulty of Paying Living Expenses: Not on file  Food Insecurity:   . Worried About 04/05/20 in the Last Year: Not on file  . Ran Out of Food in the Last Year: Not on file  Transportation Needs:   . Lack of Transportation (Medical): Not on file  . Lack of Transportation (Non-Medical): Not on file  Physical Activity:   . Days of Exercise per Week: Not on file  . Minutes of Exercise per Session: Not on file  Stress:   .  Feeling of Stress : Not on file  Social Connections:   . Frequency of Communication with Friends and Family: Not on file  . Frequency of Social Gatherings with Friends and Family: Not on file  . Attends Religious Services: Not on file  . Active Member of Clubs or Organizations: Not on file  . Attends Banker Meetings: Not on file  . Marital Status: Not on file   Family History  Problem Relation Age of Onset  . Stroke Paternal Uncle    History reviewed. No pertinent surgical history.    Mardella Layman, MD 04/04/20 0930

## 2020-04-03 NOTE — ED Triage Notes (Signed)
Pt presents with left shoulder pain xs 3 days. States has gotten progressively worse over the past two days and gets worse with movement. States has taken ibuprofen with minimal relief.

## 2020-04-30 ENCOUNTER — Ambulatory Visit (HOSPITAL_COMMUNITY)
Admission: EM | Admit: 2020-04-30 | Discharge: 2020-04-30 | Disposition: A | Discharge status: Home / Self Care | Payer: Self-pay | Attending: Student | Admitting: Student

## 2020-04-30 ENCOUNTER — Other Ambulatory Visit: Payer: Self-pay

## 2020-04-30 ENCOUNTER — Encounter (HOSPITAL_COMMUNITY): Payer: Self-pay | Admitting: Emergency Medicine

## 2020-04-30 DIAGNOSIS — M7522 Bicipital tendinitis, left shoulder: Secondary | ICD-10-CM

## 2020-04-30 MED ORDER — PREDNISONE 10 MG (21) PO TBPK: ORAL_TABLET | Freq: Every day | ORAL | 0 refills | Status: AC

## 2020-04-30 NOTE — ED Provider Notes (Signed)
[facility]    CSN: [id number] Arrival date & time: 04/30/20  0258      History   Chief Complaint Chief Complaint  Patient presents with  . Shoulder Pain    HPI Lee Alvarez is a 39 y.o. male presenting with left shoulder pain for 4 weeks. Denies history of cardiac issues, gallbladder issues in the past. States he's been having left-sided shoulder pain for 4 weeks. He was seen at this urgent care at onset of pain and treated with prednisone and voltaren for biceps tendonitis; he states this improved the pain by 60% but it returned. He did not follow-up with sports medicine center as directed. States it's difficult to work with this pain, and that work is requiring note stating pt's limitations. Denies trauma. States pain began suddenly and without injury. Denies pain at rest but feels pain with movement. Denies chest pain.  HPI  Past Medical History:  Diagnosis Date  . Asthma     There are no problems to display for this patient.   History reviewed. No pertinent surgical history.     Home Medications    Prior to Admission medications   Medication Sig Start Date End Date Taking? Authorizing Provider  predniSONE (STERAPRED UNI-PAK 21 TAB) 10 MG (21) TBPK tablet Take by mouth daily. Take 6 tabs by mouth daily  for 2 days, then 5 tabs for 2 days, then 4 tabs for 2 days, then 3 tabs for 2 days, 2 tabs for 2 days, then 1 tab by mouth daily for 2 days 04/30/20  Yes Rhys Martini, PA-C    Family History Family History  Problem Relation Age of Onset  . Stroke Paternal Uncle     Social History Social History   Tobacco Use  . Smoking status: Never Smoker  . Smokeless tobacco: Never Used  Substance Use Topics  . Alcohol use: No  . Drug use: No     Allergies   Other and Quinine derivatives   Review of Systems Review of Systems  Musculoskeletal:       Right shoulder pain  All other systems reviewed and are negative.    Physical Exam Triage  Vital Signs ED Triage Vitals  Enc Vitals Group     BP 04/30/20 0911 137/89     Pulse Rate 04/30/20 0911 67     Resp 04/30/20 0911 17     Temp 04/30/20 0911 98.1 F (36.7 C)     Temp Source 04/30/20 0911 Oral     SpO2 04/30/20 0911 100 %     Weight 04/30/20 0906 170 lb (77.1 kg)     Height 04/30/20 0906 6\' 2"  (1.88 m)     Head Circumference --      Peak Flow --      Pain Score 04/30/20 0905 6     Pain Loc --      Pain Edu? --      Excl. in GC? --    No data found.  Updated Vital Signs BP 137/89 (BP Location: Right Arm)   Pulse 67   Temp 98.1 F (36.7 C) (Oral)   Resp 17   Ht 6\' 2"  (1.88 m)   Wt 170 lb (77.1 kg)   SpO2 100%   BMI 21.83 kg/m   Visual Acuity Right Eye Distance:   Left Eye Distance:   Bilateral Distance:    Right Eye Near:   Left Eye Near:    Bilateral Near:  Physical Exam Vitals reviewed.  Constitutional:      Appearance: Normal appearance.  HENT:     Head: Normocephalic and atraumatic.  Cardiovascular:     Rate and Rhythm: Normal rate and regular rhythm.     Heart sounds: Normal heart sounds.  Pulmonary:     Effort: Pulmonary effort is normal.     Breath sounds: Normal breath sounds.  Musculoskeletal:     Comments: L shoulder tender to palpation over proximal biceps tendon. Questionably positive speeds's test;  Negative empty beer can, negative apley scratch test  Neurovascularly intact.   Neurological:     General: No focal deficit present.     Mental Status: He is alert and oriented to person, place, and time.  Psychiatric:        Attention and Perception: Attention and perception normal.        Mood and Affect: Mood is anxious.      UC Treatments / Results  Labs (all labs ordered are listed, but only abnormal results are displayed) Labs Reviewed - No data to display  EKG   Radiology No results found.  Procedures Procedures (including critical care time)  Medications Ordered in UC Medications - No data to  display  Initial Impression / Assessment and Plan / UC Course  I have reviewed the triage vital signs and the nursing notes.  Pertinent labs & imaging results that were available during my care of the patient were reviewed by me and considered in my medical decision making (see chart for details).     Discussed option of muscle relaxer vs another course of prednisone; pt states he works at night and cannot be drowsy, so second course of prednisone sent. Pt does not have diabetes. Again discussed that he MUST follow-up with sports medicine clinic if pain persists treatment. Pt verbalizes understanding and agreement.   Work note provided, limiting heavy lifting to 20 pounds while symptoms persist.   Final Clinical Impressions(s) / UC Diagnoses   Final diagnoses:  Biceps tendonitis, left     Discharge Instructions     If there are no improvements in your symptoms on the steroid, please make an appointment with the sports medicine clinic. Information is provided below. You must call them to schedule this appointment.     ED Prescriptions    Medication Sig Dispense Auth. Provider   predniSONE (STERAPRED UNI-PAK 21 TAB) 10 MG (21) TBPK tablet Take by mouth daily. Take 6 tabs by mouth daily  for 2 days, then 5 tabs for 2 days, then 4 tabs for 2 days, then 3 tabs for 2 days, 2 tabs for 2 days, then 1 tab by mouth daily for 2 days 42 tablet Rhys Martini, PA-C     PDMP not reviewed this encounter.   Rhys Martini, PA-C 04/30/20 1040

## 2020-04-30 NOTE — Discharge Instructions (Addendum)
If there are no improvements in your symptoms on the steroid, please make an appointment with the sports medicine clinic. Information is provided below. You must call them to schedule this appointment.

## 2020-04-30 NOTE — ED Triage Notes (Signed)
Patient c/o LFT sided shoulder pain x 4 weeks ago.   Patient endorses being seen at this clinic and given a prescription. Patient states symptoms have not resolved.   Patient endorses he's unable to work with pain.   Patient denies any fall or trauma to arm. Patient states pain began suddenly without any initial injury at work.

## 2020-08-21 ENCOUNTER — Emergency Department (HOSPITAL_COMMUNITY): Payer: Self-pay

## 2020-08-21 ENCOUNTER — Emergency Department (HOSPITAL_COMMUNITY)
Admission: EM | Admit: 2020-08-21 | Discharge: 2020-08-22 | Disposition: A | Discharge status: Home / Self Care | Payer: Self-pay | Attending: Emergency Medicine | Admitting: Emergency Medicine

## 2020-08-21 ENCOUNTER — Other Ambulatory Visit: Payer: Self-pay

## 2020-08-21 DIAGNOSIS — Z20822 Contact with and (suspected) exposure to covid-19: Secondary | ICD-10-CM | POA: Insufficient documentation

## 2020-08-21 DIAGNOSIS — J45909 Unspecified asthma, uncomplicated: Secondary | ICD-10-CM | POA: Insufficient documentation

## 2020-08-21 DIAGNOSIS — R0602 Shortness of breath: Secondary | ICD-10-CM | POA: Insufficient documentation

## 2020-08-21 DIAGNOSIS — R0981 Nasal congestion: Secondary | ICD-10-CM | POA: Insufficient documentation

## 2020-08-21 MED ORDER — ALBUTEROL SULFATE HFA 108 (90 BASE) MCG/ACT IN AERS
2.0000 | INHALATION_SPRAY | RESPIRATORY_TRACT | Status: DC | PRN
  Filled 2020-08-21: qty 6.7

## 2020-08-21 NOTE — ED Triage Notes (Signed)
C/o shortness of breath and congestion since early this morning. Denies any chest pain, dizziness and headache.   Used to take inhaler.

## 2020-08-22 LAB — RESP PANEL BY RT-PCR (FLU A&B, COVID) ARPGX2
Influenza A by PCR: NEGATIVE
Influenza B by PCR: NEGATIVE
SARS Coronavirus 2 by RT PCR: NEGATIVE

## 2020-08-22 NOTE — ED Provider Notes (Signed)
[facility] EMERGENCY DEPARTMENT Provider Note   CSN: [id number] Arrival date & time: 08/21/20  2216     History Chief Complaint  Patient presents with  . Shortness of Breath  . Nasal Congestion    Lee Alvarez is a 40 y.o. male.  The history is provided by the patient and medical records.  Shortness of Breath    40 y.o. M with hx of asthma, presenting to the ED with SOB.  States he woke up this morning and had a lot of nasal congestion and felt like he could not breath properly.  He attributed this to allergies so tried some OTC medications without change.  States he went to work and continued to have symptoms.  States his chest feels almost like it is constricted and he is just not getting all of his air.  He states when he used to have symptoms like this before he used inhaler which helped a lot.  He has not had an inhaler in several years.  Also reports son was sick with "covid like illness last week" and seen in ED for same.  He denies other sick contacts.  He denies any chest pain.  Past Medical History:  Diagnosis Date  . Asthma     There are no problems to display for this patient.   No past surgical history on file.     Family History  Problem Relation Age of Onset  . Stroke Paternal Uncle     Social History   Tobacco Use  . Smoking status: Never Smoker  . Smokeless tobacco: Never Used  Substance Use Topics  . Alcohol use: No  . Drug use: No    Home Medications Prior to Admission medications   Medication Sig Start Date End Date Taking? Authorizing Provider  predniSONE (STERAPRED UNI-PAK 21 TAB) 10 MG (21) TBPK tablet Take by mouth daily. Take 6 tabs by mouth daily  for 2 days, then 5 tabs for 2 days, then 4 tabs for 2 days, then 3 tabs for 2 days, 2 tabs for 2 days, then 1 tab by mouth daily for 2 days 04/30/20   Rhys Martini, PA-C    Allergies    Other and Quinine derivatives  Review of Systems   Review of Systems   Respiratory: Positive for shortness of breath.   All other systems reviewed and are negative.   Physical Exam Updated Vital Signs BP 122/77 (BP Location: Right Arm)   Pulse 72   Temp 98.4 F (36.9 C) (Oral)   Resp 16   Ht 6\' 2"  (1.88 m)   Wt 77.1 kg   SpO2 98%   BMI 21.82 kg/m   Physical Exam Vitals and nursing note reviewed.  Constitutional:      Appearance: He is well-developed.  HENT:     Head: Normocephalic and atraumatic.     Nose: Congestion present.  Eyes:     Conjunctiva/sclera: Conjunctivae normal.     Pupils: Pupils are equal, round, and reactive to light.  Cardiovascular:     Rate and Rhythm: Normal rate and regular rhythm.     Heart sounds: Normal heart sounds.  Pulmonary:     Effort: Pulmonary effort is normal.     Breath sounds: Normal breath sounds. No wheezing or rhonchi.     Comments: Lungs CTAB,  NAD, able to speak in full sentences without difficulty Abdominal:     General: Bowel sounds are normal.     Palpations: Abdomen is  soft.  Musculoskeletal:        General: Normal range of motion.     Cervical back: Normal range of motion.  Skin:    General: Skin is warm and dry.  Neurological:     Mental Status: He is alert and oriented to person, place, and time.     ED Results / Procedures / Treatments   Labs (all labs ordered are listed, but only abnormal results are displayed) Labs Reviewed  RESP PANEL BY RT-PCR (FLU A&B, COVID) ARPGX2    EKG None  Radiology DG Chest 2 View  Result Date: 08/21/2020 CLINICAL DATA:  Shortness of breath EXAM: CHEST - 2 VIEW COMPARISON:  02/07/2014 FINDINGS: The heart size and mediastinal contours are within normal limits. Both lungs are clear. The visualized skeletal structures are unremarkable. IMPRESSION: No active cardiopulmonary disease. Electronically Signed   By: Deatra Robinson M.D.   On: 08/21/2020 23:25    Procedures Procedures   Medications Ordered in ED Medications  albuterol (VENTOLIN HFA) 108  (90 Base) MCG/ACT inhaler 2 puff (has no administration in time range)    ED Course  I have reviewed the triage vital signs and the nursing notes.  Pertinent labs & imaging results that were available during my care of the patient were reviewed by me and considered in my medical decision making (see chart for details).    MDM Rules/Calculators/A&P  40 y.o. M here with SOB.  Suspect this is related to asthma +/- URI symptoms.  Son sick with "covid like illness" last week per patient.  He is afebrile, non-toxic.  VSS on RA.  No respiratory distress noted, speaking in full sentences without difficulty.  Denies chest pain.  CXR clear.  covid screen sent, will be notified if positive.  Given albuterol inhaler, he understands home use.  Can follow-up with PCP.  Return here for new concerns.  Final Clinical Impression(s) / ED Diagnoses Final diagnoses:  Shortness of breath    Rx / DC Orders ED Discharge Orders    None       Garlon Hatchet, PA-C 08/22/20 0121    Nira Conn, MD 08/23/20 [phone]

## 2020-08-22 NOTE — Discharge Instructions (Signed)
Can use inhaler when needed.  You will be notified if covid test is positive. Follow-up with your primary care doctor. Return to the ED for new or worsening symptoms.

## 2021-07-31 ENCOUNTER — Encounter (HOSPITAL_BASED_OUTPATIENT_CLINIC_OR_DEPARTMENT_OTHER): Payer: Self-pay

## 2021-07-31 ENCOUNTER — Other Ambulatory Visit: Payer: Self-pay

## 2021-07-31 ENCOUNTER — Emergency Department (HOSPITAL_BASED_OUTPATIENT_CLINIC_OR_DEPARTMENT_OTHER): Payer: Self-pay

## 2021-07-31 ENCOUNTER — Emergency Department (HOSPITAL_BASED_OUTPATIENT_CLINIC_OR_DEPARTMENT_OTHER)
Admission: EM | Admit: 2021-07-31 | Discharge: 2021-07-31 | Disposition: A | Discharge status: Home / Self Care | Payer: Self-pay | Attending: Emergency Medicine | Admitting: Emergency Medicine

## 2021-07-31 DIAGNOSIS — R0602 Shortness of breath: Secondary | ICD-10-CM

## 2021-07-31 DIAGNOSIS — Z20822 Contact with and (suspected) exposure to covid-19: Secondary | ICD-10-CM | POA: Insufficient documentation

## 2021-07-31 DIAGNOSIS — R0981 Nasal congestion: Secondary | ICD-10-CM

## 2021-07-31 LAB — RESP PANEL BY RT-PCR (FLU A&B, COVID) ARPGX2
Influenza A by PCR: NEGATIVE
Influenza B by PCR: NEGATIVE
SARS Coronavirus 2 by RT PCR: NEGATIVE

## 2021-07-31 MED ORDER — FLUTICASONE PROPIONATE 50 MCG/ACT NA SUSP: 2.0000 | Freq: Every day | NASAL | 0 refills | Status: DC

## 2021-07-31 MED ORDER — IPRATROPIUM-ALBUTEROL 0.5-2.5 (3) MG/3ML IN SOLN
3.0000 mL | Freq: Once | RESPIRATORY_TRACT | Status: AC
  Administered 2021-07-31: 3 mL via RESPIRATORY_TRACT
  Filled 2021-07-31: qty 3

## 2021-07-31 NOTE — Discharge Instructions (Signed)
Please use 2 sprays of fluticasone nasal spray in each nostril for nasal congestion.  Return to the emergency department for any worsening symptoms. ?

## 2021-07-31 NOTE — ED Triage Notes (Addendum)
Pt c/o itchy/water eyes, SOB started yesterday-states SOB worsened throughout the day-reports minimal cough/no fever-NAD-steady gait ?

## 2021-07-31 NOTE — ED Provider Notes (Signed)
?[facility] EMERGENCY DEPARTMENT ?Provider Note ? ? ?CSN: [id number] ?Arrival date & time: 07/31/21  1154 ? ?  ? ?History ?No chief complaint on file. ? ? ?Lee Alvarez is a 41 y.o. male who presents to the emergency department with 1 day history of shortness of breath.  Patient states that yesterday he started getting some nasal congestion and thought it was allergies.  Took Claritin with little relief.  Last night he had trouble breathing where he states he could not take a full breath.  He said he could not breathe and approximately 70%.  He denies fever, chills, sore throat, abdominal pain, nausea, diarrhea.  Patient's shortness of breath is currently improving. ? ?HPI ? ?  ? ?Home Medications ?Prior to Admission medications   ?Medication Sig Start Date End Date Taking? Authorizing Provider  ?fluticasone (FLONASE) 50 MCG/ACT nasal spray Place 2 sprays into both nostrils daily. 07/31/21  Yes Carolyn Maniscalco M, PA-C  ?predniSONE (STERAPRED UNI-PAK 21 TAB) 10 MG (21) TBPK tablet Take by mouth daily. Take 6 tabs by mouth daily  for 2 days, then 5 tabs for 2 days, then 4 tabs for 2 days, then 3 tabs for 2 days, 2 tabs for 2 days, then 1 tab by mouth daily for 2 days 04/30/20   Rhys Martini, PA-C  ?   ? ?Allergies    ?Other and Quinine derivatives   ? ?Review of Systems   ?Review of Systems  ?All other systems reviewed and are negative. ? ?Physical Exam ?Updated Vital Signs ?BP 115/83   Pulse 65   Temp 98.4 ?F (36.9 ?C) (Oral)   Resp 17   Ht 6\' 2"  (1.88 m)   Wt 95.3 kg   SpO2 99%   BMI 26.96 kg/m?  ?Physical Exam ?Vitals and nursing note reviewed.  ?Constitutional:   ?   General: He is not in acute distress. ?   Appearance: Normal appearance.  ?HENT:  ?   Head: Normocephalic and atraumatic.  ?Eyes:  ?   General:     ?   Right eye: No discharge.     ?   Left eye: No discharge.  ?Cardiovascular:  ?   Comments: Regular rate and rhythm.  S1/S2 are distinct without any evidence of murmur, rubs, or  gallops.  Radial pulses are 2+ bilaterally.  Dorsalis pedis pulses are 2+ bilaterally.  No evidence of pedal edema. ?Pulmonary:  ?   Comments: Clear to auscultation bilaterally.  Normal effort.  No respiratory distress.  No evidence of wheezes, rales, or rhonchi heard throughout. ?Abdominal:  ?   General: Abdomen is flat. Bowel sounds are normal. There is no distension.  ?   Tenderness: There is no abdominal tenderness. There is no guarding or rebound.  ?Musculoskeletal:     ?   General: Normal range of motion.  ?   Cervical back: Neck supple.  ?Skin: ?   General: Skin is warm and dry.  ?   Findings: No rash.  ?Neurological:  ?   General: No focal deficit present.  ?   Mental Status: He is alert.  ?Psychiatric:     ?   Mood and Affect: Mood normal.     ?   Behavior: Behavior normal.  ? ? ?ED Results / Procedures / Treatments   ?Labs ?(all labs ordered are listed, but only abnormal results are displayed) ?Labs Reviewed  ?RESP PANEL BY RT-PCR (FLU A&B, COVID) ARPGX2  ? ? ?EKG ?None ? ?Radiology ?DG  Chest 2 View ? ?Result Date: 07/31/2021 ?CLINICAL DATA:  Shortness of breath EXAM: CHEST - 2 VIEW COMPARISON:  Chest radiograph dated August 21, 2020 FINDINGS: The heart size and mediastinal contours are within normal limits. Both lungs are clear. The visualized skeletal structures are unremarkable. IMPRESSION: No active cardiopulmonary disease. Electronically Signed   By: Larose Hires D.O.   On: 07/31/2021 13:20   ? ?Procedures ?Procedures  ? ? ?Medications Ordered in ED ?Medications  ?ipratropium-albuterol (DUONEB) 0.5-2.5 (3) MG/3ML nebulizer solution 3 mL (3 mLs Nebulization Given 07/31/21 1339)  ? ? ?ED Course/ Medical Decision Making/ A&P ?Clinical Course as of 07/31/21 1448  ?Wed Jul 31, 2021  ?1441 On reevaluation, patient states he is feeling better.  Does note some mild nasal congestion.  He is not having that sensation of shortness of breath currently.  We discussed the findings of the labs and imaging.  Strict  return precautions were discussed with the patient.  I have a low suspicion for emergent pathology at this time.  He is safe for discharge. [CF]  ?  ?Clinical Course User Index ?[CF] Honor Loh M, PA-C  ? ?                        ?Medical Decision Making ?Amount and/or Complexity of Data Reviewed ?Radiology: ordered. ? ?Risk ?Prescription drug management. ? ? ?[name] is a 41 y.o. male who presents to the emergency department today with sensation of shortness of breath.  Currently in the department, patient is asymptomatic.  He states that he had the sensation of shortness of breath last night while he was sleeping.  Clinically, patient is overall well-appearing and in no acute distress.  Vital signs are completely normal.  No evidence of tachycardia, tachypnea, or hypoxia.  Patient does have some slight nasal congestion.  Clinically I doubt pneumonia.  I have a low suspicion for PE.  Doubt ACS.  I personally ordered and interpreted labs and imaging to include respiratory panel which was negative for COVID and flu.  Chest x-ray was also negative for pneumonia, pneumothorax, cardiomegaly, or pulmonary congestion.  I will prescribe him fluticasone nasal spray and encouraged him to return to the emergency department for worsening symptoms.  He is safe for discharge. ? ?Final Clinical Impression(s) / ED Diagnoses ?Final diagnoses:  ?Shortness of breath  ?Nasal congestion  ? ? ?Rx / DC Orders ?ED Discharge Orders   ? ?      Ordered  ?  fluticasone (FLONASE) 50 MCG/ACT nasal spray  Daily       ? 07/31/21 1448  ? ?  ?  ? ?  ? ? ?  ?Honor Loh [place], PA-C ?07/31/21 1449 ? ?  ?Vanetta Mulders, MD ?08/01/21 [phone] ? ?

## 2021-11-13 ENCOUNTER — Other Ambulatory Visit: Payer: Self-pay

## 2021-11-13 ENCOUNTER — Emergency Department (HOSPITAL_BASED_OUTPATIENT_CLINIC_OR_DEPARTMENT_OTHER)
Admission: EM | Admit: 2021-11-13 | Discharge: 2021-11-13 | Disposition: A | Discharge status: Home / Self Care | Payer: Self-pay | Attending: Emergency Medicine | Admitting: Emergency Medicine

## 2021-11-13 ENCOUNTER — Encounter (HOSPITAL_BASED_OUTPATIENT_CLINIC_OR_DEPARTMENT_OTHER): Payer: Self-pay | Admitting: Emergency Medicine

## 2021-11-13 DIAGNOSIS — U071 COVID-19: Secondary | ICD-10-CM | POA: Insufficient documentation

## 2021-11-13 LAB — SARS CORONAVIRUS 2 BY RT PCR: SARS Coronavirus 2 by RT PCR: POSITIVE — AB

## 2021-11-13 MED ORDER — NAPROXEN 500 MG PO TABS: 500.0000 mg | ORAL_TABLET | Freq: Two times a day (BID) | ORAL | 0 refills | Status: AC

## 2021-11-13 MED ORDER — BENZONATATE 100 MG PO CAPS: 100.0000 mg | ORAL_CAPSULE | Freq: Three times a day (TID) | ORAL | 0 refills | Status: DC

## 2021-11-13 MED ORDER — CETIRIZINE HCL 10 MG PO TABS: 10.0000 mg | ORAL_TABLET | Freq: Every day | ORAL | 0 refills | Status: AC

## 2021-11-13 MED ORDER — FLUTICASONE PROPIONATE 50 MCG/ACT NA SUSP: 2.0000 | Freq: Every day | NASAL | 0 refills | Status: DC

## 2021-11-13 MED ORDER — ACETAMINOPHEN 325 MG PO TABS
650.0000 mg | ORAL_TABLET | Freq: Once | ORAL | Status: AC
  Administered 2021-11-13: 650 mg via ORAL
  Filled 2021-11-13: qty 2

## 2021-11-13 MED ORDER — NAPROXEN 500 MG PO TABS: 500.0000 mg | ORAL_TABLET | Freq: Two times a day (BID) | ORAL | 0 refills | Status: DC

## 2021-11-13 MED ORDER — CETIRIZINE HCL 10 MG PO TABS: 10.0000 mg | ORAL_TABLET | Freq: Every day | ORAL | 0 refills | Status: DC

## 2021-11-13 MED ORDER — BENZONATATE 100 MG PO CAPS: 100.0000 mg | ORAL_CAPSULE | Freq: Three times a day (TID) | ORAL | 0 refills | Status: AC

## 2021-11-13 MED ORDER — FLUTICASONE PROPIONATE 50 MCG/ACT NA SUSP: 2.0000 | Freq: Every day | NASAL | 0 refills | Status: AC

## 2021-11-13 NOTE — Discharge Instructions (Signed)
Your COVID test did come back positive while you are waiting.  You will need to isolate at home for 5 days.  Continue to wear a mask at work for 5 days after this.  I have written you for a few medications to help with your symptoms.  Return for new or worsening symptoms

## 2021-11-13 NOTE — ED Provider Notes (Signed)
[facility] EMERGENCY DEPARTMENT Provider Note   CSN: [id number] Arrival date & time: 11/13/21  1839     History  Chief Complaint  Patient presents with   Nasal Congestion    Lee Alvarez is a 41 y.o. male no significant past medical history here for evaluation of feeling unwell.  Patient returned 2 days ago after overseas travel.  Since that he has had fever, sneezing, congestion, rhinorrhea, cough and felt.  Any meds at home.  No headache, chest pain, shortness of breath, sore throat.  Has some myalgias.  Tolerating p.o. intake at home without difficulty.  No sick contacts.  HPI     Home Medications Prior to Admission medications   Medication Sig Start Date End Date Taking? Authorizing Provider  benzonatate (TESSALON) 100 MG capsule Take 1 capsule (100 mg total) by mouth every 8 (eight) hours. 11/13/21  Yes Tawny Raspberry A, PA-C  cetirizine (ZYRTEC ALLERGY) 10 MG tablet Take 1 tablet (10 mg total) by mouth daily. 11/13/21  Yes Marvelene Stoneberg A, PA-C  fluticasone (FLONASE) 50 MCG/ACT nasal spray Place 2 sprays into both nostrils daily. 11/13/21  Yes Aissa Lisowski A, PA-C  naproxen (NAPROSYN) 500 MG tablet Take 1 tablet (500 mg total) by mouth 2 (two) times daily. 11/13/21  Yes Jasmarie Coppock A, PA-C  predniSONE (STERAPRED UNI-PAK 21 TAB) 10 MG (21) TBPK tablet Take by mouth daily. Take 6 tabs by mouth daily  for 2 days, then 5 tabs for 2 days, then 4 tabs for 2 days, then 3 tabs for 2 days, 2 tabs for 2 days, then 1 tab by mouth daily for 2 days 04/30/20   Rhys Martini, PA-C      Allergies    Other and Quinine derivatives    Review of Systems   Review of Systems  Constitutional:  Positive for activity change, appetite change, fatigue and fever.  HENT:  Positive for congestion, postnasal drip and rhinorrhea. Negative for dental problem, drooling, ear discharge, ear pain, facial swelling, hearing loss, mouth sores, nosebleeds, sinus pressure, sinus pain,  sneezing, sore throat, tinnitus, trouble swallowing and voice change.   Respiratory:  Positive for cough.   Cardiovascular: Negative.   Gastrointestinal: Negative.   Genitourinary: Negative.   Musculoskeletal:  Positive for myalgias.  Skin: Negative.   Neurological: Negative.   All other systems reviewed and are negative.   Physical Exam Updated Vital Signs BP 117/89 (BP Location: Right Arm)   Pulse 81   Temp 99.6 F (37.6 C) (Oral)   Resp 16   Ht 6\' 2"  (1.88 m)   Wt 100 kg   SpO2 100%   BMI 28.31 kg/m  Physical Exam Vitals and nursing note reviewed.  Constitutional:      General: He is not in acute distress.    Appearance: He is not ill-appearing, toxic-appearing or diaphoretic.  HENT:     Head: Normocephalic and atraumatic.     Jaw: There is normal jaw occlusion.     Right Ear: Tympanic membrane, ear canal and external ear normal. There is no impacted cerumen. No hemotympanum. Tympanic membrane is not injected, scarred, perforated, erythematous, retracted or bulging.     Left Ear: Tympanic membrane, ear canal and external ear normal. There is no impacted cerumen. No hemotympanum. Tympanic membrane is not injected, scarred, perforated, erythematous, retracted or bulging.     Ears:     Comments: No Mastoid tenderness.    Nose:     Comments: Clear rhinorrhea and congestion  to bilateral nares.  No sinus tenderness.    Mouth/Throat:     Comments: Posterior oropharynx clear.  Mucous membranes moist.  Tonsils without erythema or exudate.  Uvula midline without deviation.  No evidence of PTA or RPA.  No drooling, dysphasia or trismus.  Phonation normal. Neck:     Trachea: Trachea and phonation normal.     Meningeal: Brudzinski's sign and Kernig's sign absent.     Comments: No Neck stiffness or neck rigidity.  No meningismus.  No cervical lymphadenopathy. Cardiovascular:     Comments: No murmurs rubs or gallops. Pulmonary:     Comments: Clear to auscultation bilaterally without  wheeze, rhonchi or rales.  No accessory muscle usage.  Able speak in full sentences. Abdominal:     Comments: Soft, nontender without rebound or guarding.  No CVA tenderness.  Musculoskeletal:     Comments: Moves all 4 extremities without difficulty.  Lower extremities without edema, erythema or warmth.  Skin:    Comments: Brisk capillary refill.  No rashes or lesions.  Neurological:     Mental Status: He is alert.     Comments: Ambulatory in department without difficulty.  Cranial nerves II through XII grossly intact.  No facial droop.  No aphasia.     ED Results / Procedures / Treatments   Labs (all labs ordered are listed, but only abnormal results are displayed) Labs Reviewed  SARS CORONAVIRUS 2 BY RT PCR - Abnormal; Notable for the following components:      Result Value   SARS Coronavirus 2 by RT PCR POSITIVE (*)    All other components within normal limits    EKG None  Radiology No results found.  Procedures Procedures    Medications Ordered in ED Medications  acetaminophen (TYLENOL) tablet 650 mg (650 mg Oral Given 11/13/21 1855)    ED Course/ Medical Decision Making/ A&P     41 year old here for evaluation of feeling unwell over the last 2 days after recent overseas travel.  He is no clinical evidence of VTE on exam.  No chest pain or shortness of breath.  He appears clinically well-hydrated.  Patient with cough, congestion, rhinorrhea, myalgias and subjective fever at home.  He has no neck stiffness or neck rigidity.  He is a nonfocal neuro exam.  His heart and lungs are clear.  His abdomen is soft, nontender.  Does have some clear rhinorrhea bilaterally and some postnasal drip.  Posterior pharynx clear with evidence of PTA or RPA.  Suspect he likely has a viral illness.  We will treat as such, he has pending COVID test  Labs personally viewed and interpreted:  COVID test positive  Discussed home isolation, symptomatic management.  He is agreeable.  Will  return for new or worsening symptoms.  The patient has been appropriately medically screened and/or stabilized in the ED. I have low suspicion for any other emergent medical condition which would require further screening, evaluation or treatment in the ED or require inpatient management.  Patient is hemodynamically stable and in no acute distress.  Patient able to ambulate in department prior to ED.  Evaluation does not show acute pathology that would require ongoing or additional emergent interventions while in the emergency department or further inpatient treatment.  I have discussed the diagnosis with the patient and answered all questions.  Pain is been managed while in the emergency department and patient has no further complaints prior to discharge.  Patient is comfortable with plan discussed in room and is  stable for discharge at this time.  I have discussed strict return precautions for returning to the emergency department.  Patient was encouraged to follow-up with PCP/specialist refer to at discharge.                            Medical Decision Making Amount and/or Complexity of Data Reviewed External Data Reviewed: labs, radiology and notes. Labs: ordered. Decision-making details documented in ED Course.  Risk OTC drugs. Prescription drug management. Diagnosis or treatment significantly limited by social determinants of health.           Final Clinical Impression(s) / ED Diagnoses Final diagnoses:  COVID    Rx / DC Orders ED Discharge Orders          Ordered    cetirizine (ZYRTEC ALLERGY) 10 MG tablet  Daily        11/13/21 2003    fluticasone (FLONASE) 50 MCG/ACT nasal spray  Daily        11/13/21 2003    benzonatate (TESSALON) 100 MG capsule  Every 8 hours        11/13/21 2003    naproxen (NAPROSYN) 500 MG tablet  2 times daily        11/13/21 2003              Aura Dials 11/13/21 2005    Cheryll Cockayne, MD 11/16/21 2321

## 2021-11-13 NOTE — ED Triage Notes (Signed)
Nasal congestion, sneezing and intermittent fever x 2 days. Just returned from [place] yesterday. Denies cough, shob, cp, sore throat.

## 2021-11-19 ENCOUNTER — Telehealth: Payer: Self-pay | Admitting: Physician Assistant

## 2021-11-19 DIAGNOSIS — U071 COVID-19: Secondary | ICD-10-CM

## 2021-11-19 NOTE — Progress Notes (Signed)
I have spent 5 minutes in review of e-visit questionnaire, review and updating patient chart, medical decision making and response to patient.   Maurice Fotheringham Cody Kimari Coudriet, PA-C    

## 2021-11-19 NOTE — Progress Notes (Signed)
E-Visit  for Positive Covid Test Result  We are sorry you are not feeling well. We are here to help!  You have tested positive for COVID-19, meaning that you were infected with the novel coronavirus and could give the virus to others.  It is vitally important that you stay home so you do not spread it to others.      Please continue isolation at home, for at least 10 days since the start of your symptoms and until you have had 24 hours with no fever (without taking a fever reducer) and with improving of symptoms.  If you have no symptoms but tested positive (or all symptoms resolve after 5 days and you have no fever) you can leave your house but continue to wear a mask around others for an additional 5 days. If you have a fever,continue to stay home until you have had 24 hours of no fever. Most cases improve 5-10 days from onset but we have seen a small number of patients who have gotten worse after the 10 days.  Please be sure to watch for worsening symptoms and remain taking the proper precautions.   Go to the nearest hospital ED for assessment if fever/cough/breathlessness are severe or illness seems like a threat to life.    The following symptoms may appear 2-14 days after exposure: Fever Cough Shortness of breath or difficulty breathing Chills Repeated shaking with chills Muscle pain Headache Sore throat New loss of taste or smell Fatigue Congestion or runny nose Nausea or vomiting Diarrhea  You have been enrolled in MyChart Home Monitoring for COVID-19. Daily you will receive a questionnaire within the MyChart website. Our COVID-19 response team will be monitoring your responses daily.    You may also take acetaminophen (Tylenol) as needed for fever. Please continue medications as directed by the provider at your ER evaluation for COVID on the 12th.   I can provide a note for today and tomorrow if needed as you are out of your 5-day quarantine.   HOME CARE: Only take  medications as instructed by your medical team. Drink plenty of fluids and get plenty of rest. A steam or ultrasonic humidifier can help if you have congestion.   GET HELP RIGHT AWAY IF YOU HAVE EMERGENCY WARNING SIGNS.  Call 911 or proceed to your closest emergency facility if: You develop worsening high fever. Trouble breathing Bluish lips or face Persistent pain or pressure in the chest New confusion Inability to wake or stay awake You cough up blood. Your symptoms become more severe Inability to hold down food or fluids  This list is not all possible symptoms. Contact your medical provider for any symptoms that are severe or concerning to you.    Your e-visit answers were reviewed by a board certified advanced clinical practitioner to complete your personal care plan.  Depending on the condition, your plan could have included both over the counter or prescription medications.  If there is a problem please reply once you have received a response from your provider.  Your safety is important to [place].  If you have drug allergies check your prescription carefully.    You can use MyChart to ask questions about today's visit, request a non-urgent call back, or ask for a work or school excuse for 24 hours related to this e-Visit. If it has been greater than 24 hours you will need to follow up with your provider, or enter a new e-Visit to address those concerns.  You will get an e-mail in the next two days asking about your experience.  I hope that your e-visit has been valuable and will speed your recovery. Thank you for using e-visits.

## 2022-12-14 IMAGING — CR DG CHEST 2V
2 series · 2 of 2 positions shown · non-contrast
Comparison: 02/07/2014

CLINICAL DATA: Shortness of breath

EXAM:
CHEST - 2 VIEW

[chest pa]
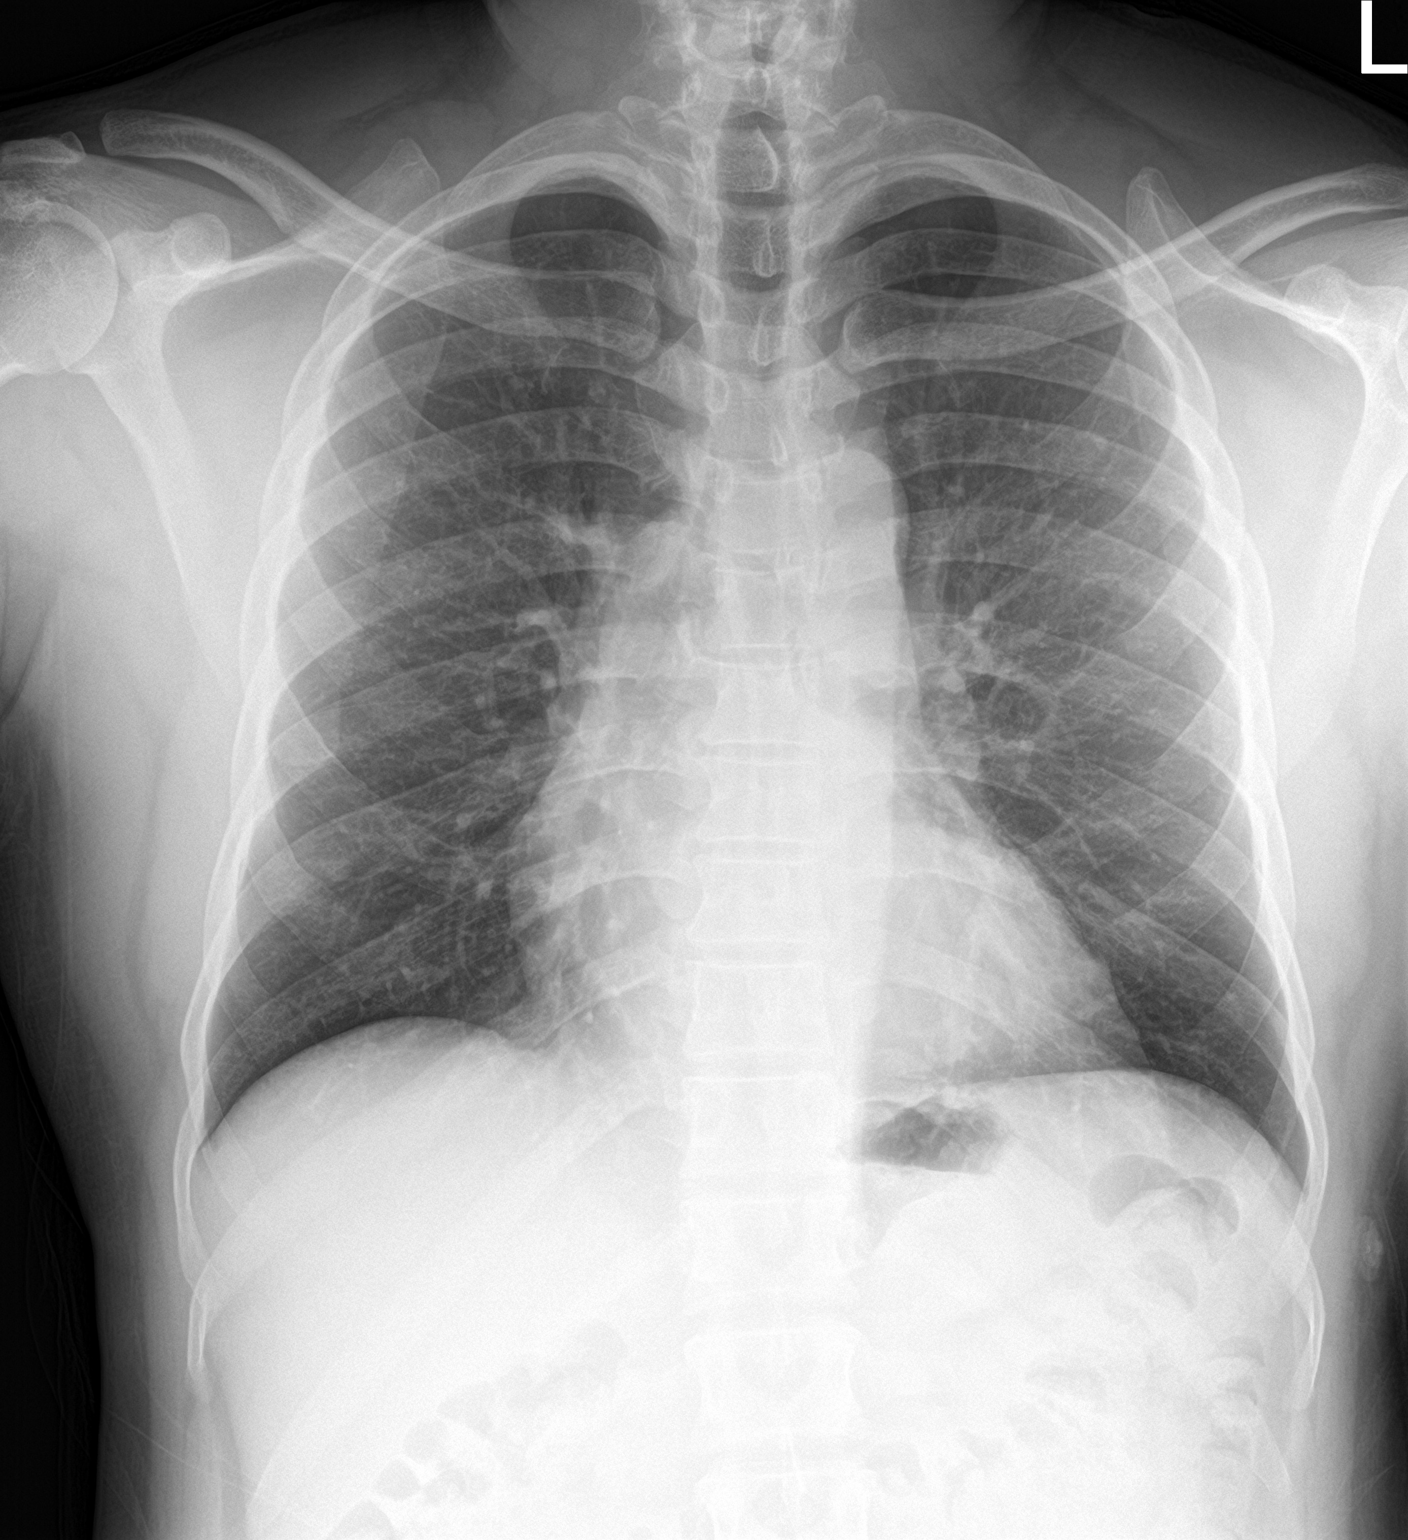

[chest lat]
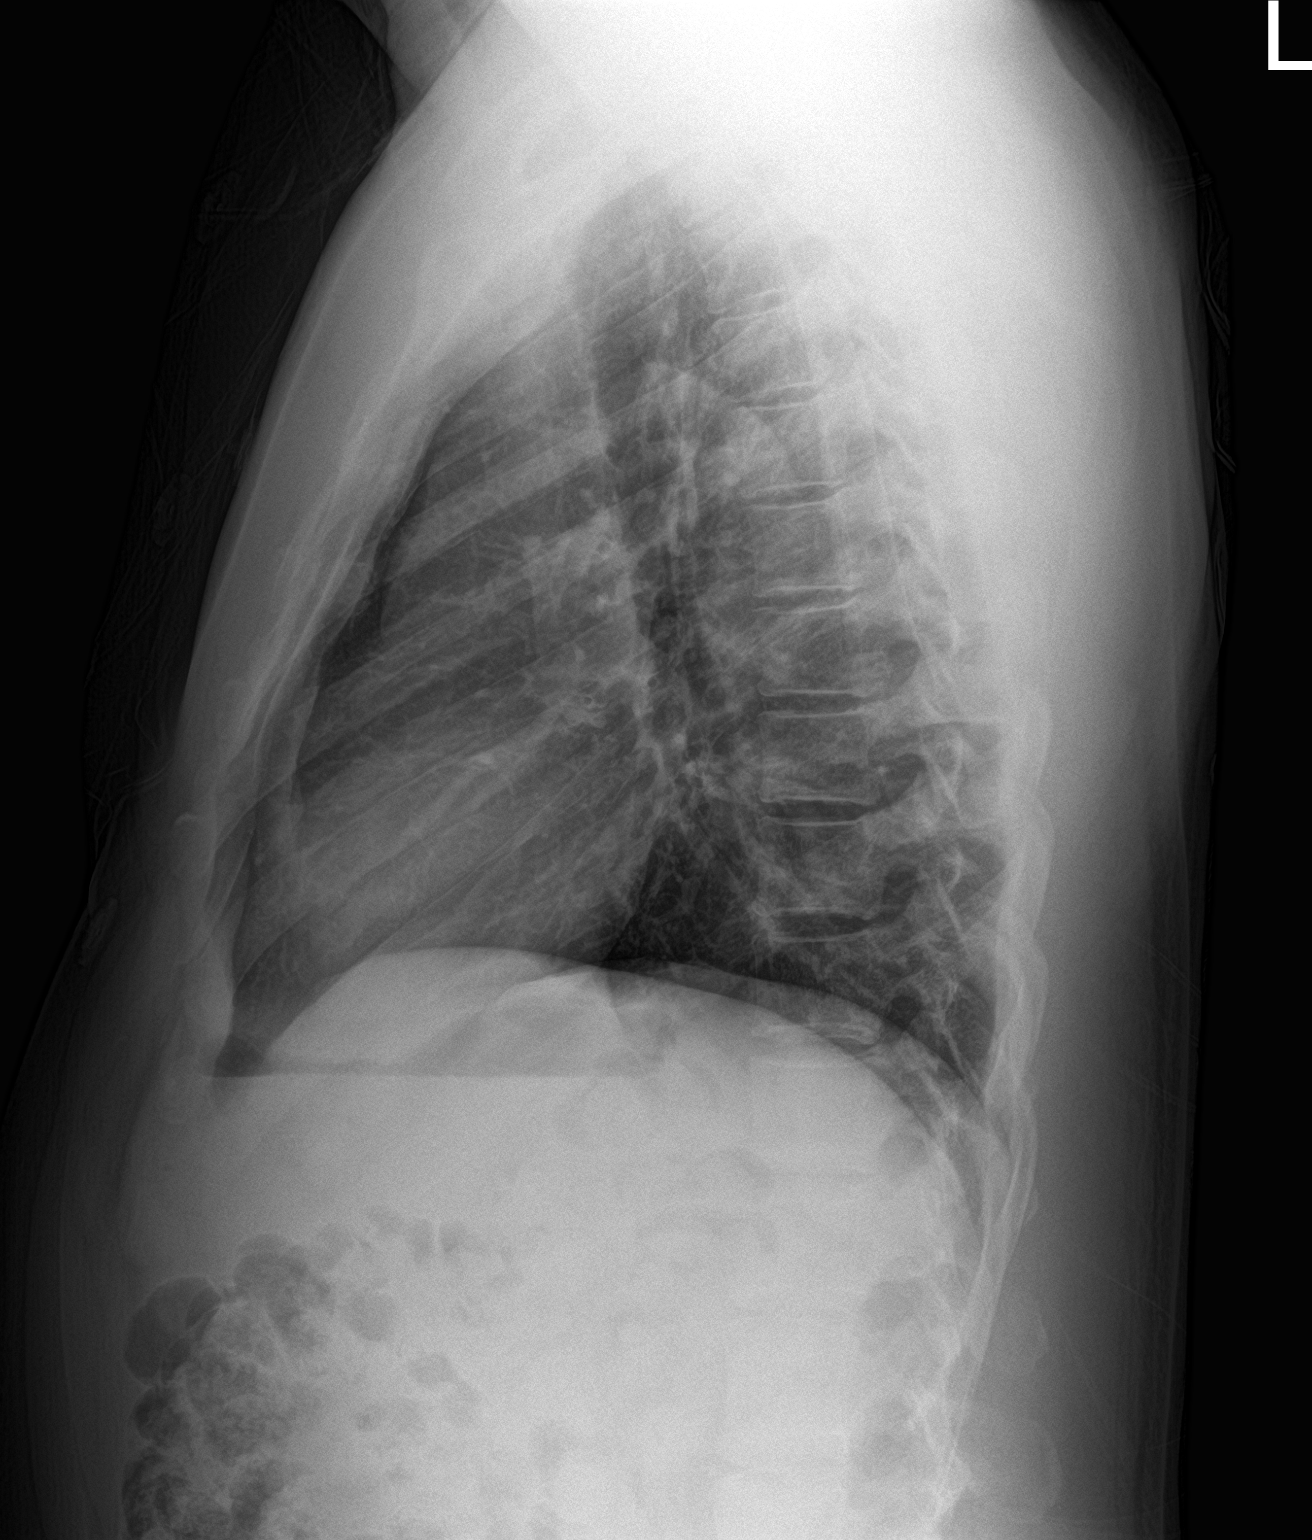

[2 of 2 positions shown; findings below may reference images not displayed]

FINDINGS: The heart size and mediastinal contours are within normal limits.
Both lungs are clear. The visualized skeletal structures are
unremarkable.
IMPRESSION: No active cardiopulmonary disease.
